# Patient Record
Sex: Female | Born: 1965 | Race: White | Hispanic: No | Smoking: Current every day smoker
Health system: Southern US, Community
[De-identification: ages and names within clinical notes are randomized; demographics above are authoritative.]

## PROBLEM LIST (undated history)

## (undated) DIAGNOSIS — M1990 Unspecified osteoarthritis, unspecified site: Secondary | ICD-10-CM

## (undated) DIAGNOSIS — T8859XA Other complications of anesthesia, initial encounter: Secondary | ICD-10-CM

## (undated) DIAGNOSIS — Z9189 Other specified personal risk factors, not elsewhere classified: Secondary | ICD-10-CM

## (undated) DIAGNOSIS — F32A Depression, unspecified: Secondary | ICD-10-CM

## (undated) DIAGNOSIS — F329 Major depressive disorder, single episode, unspecified: Secondary | ICD-10-CM

## (undated) DIAGNOSIS — T4145XA Adverse effect of unspecified anesthetic, initial encounter: Secondary | ICD-10-CM

## (undated) HISTORY — PX: CARPAL TUNNEL RELEASE: SHX101

## (undated) HISTORY — PX: DENTAL SURGERY: SHX609

---

## 2003-01-21 ENCOUNTER — Emergency Department (HOSPITAL_COMMUNITY): Admission: EM | Admit: 2003-01-21 | Discharge: 2003-01-21 | Payer: Self-pay | Admitting: Emergency Medicine

## 2003-10-22 ENCOUNTER — Encounter (INDEPENDENT_AMBULATORY_CARE_PROVIDER_SITE_OTHER): Payer: Self-pay | Admitting: *Deleted

## 2003-10-22 ENCOUNTER — Encounter: Admission: RE | Admit: 2003-10-22 | Discharge: 2003-10-22 | Payer: Self-pay

## 2004-02-17 ENCOUNTER — Emergency Department (HOSPITAL_COMMUNITY): Admission: EM | Admit: 2004-02-17 | Discharge: 2004-02-17 | Payer: Self-pay | Admitting: Emergency Medicine

## 2004-03-13 ENCOUNTER — Ambulatory Visit (HOSPITAL_BASED_OUTPATIENT_CLINIC_OR_DEPARTMENT_OTHER): Admission: RE | Admit: 2004-03-13 | Discharge: 2004-03-13 | Payer: Self-pay | Admitting: Orthopedic Surgery

## 2004-03-15 ENCOUNTER — Emergency Department (HOSPITAL_COMMUNITY): Admission: EM | Admit: 2004-03-15 | Discharge: 2004-03-15 | Payer: Self-pay | Admitting: Emergency Medicine

## 2004-04-11 ENCOUNTER — Ambulatory Visit (HOSPITAL_BASED_OUTPATIENT_CLINIC_OR_DEPARTMENT_OTHER): Admission: RE | Admit: 2004-04-11 | Discharge: 2004-04-11 | Payer: Self-pay | Admitting: Orthopedic Surgery

## 2004-11-01 ENCOUNTER — Emergency Department (HOSPITAL_COMMUNITY): Admission: EM | Admit: 2004-11-01 | Discharge: 2004-11-01 | Payer: Self-pay | Admitting: Emergency Medicine

## 2008-07-27 ENCOUNTER — Emergency Department (HOSPITAL_COMMUNITY): Admission: EM | Admit: 2008-07-27 | Discharge: 2008-07-27 | Payer: Self-pay | Admitting: Emergency Medicine

## 2010-04-20 LAB — DIFFERENTIAL
Basophils Absolute: 0 10*3/uL (ref 0.0–0.1)
Basophils Relative: 0 % (ref 0–1)
Eosinophils Absolute: 0.2 10*3/uL (ref 0.0–0.7)
Eosinophils Relative: 2 % (ref 0–5)
Lymphocytes Relative: 38 % (ref 12–46)
Lymphs Abs: 3.5 10*3/uL (ref 0.7–4.0)
Monocytes Absolute: 0.7 10*3/uL (ref 0.1–1.0)
Monocytes Relative: 8 % (ref 3–12)
Neutro Abs: 4.8 10*3/uL (ref 1.7–7.7)
Neutrophils Relative %: 52 % (ref 43–77)

## 2010-04-20 LAB — WET PREP, GENITAL
Clue Cells Wet Prep HPF POC: NONE SEEN
Trich, Wet Prep: NONE SEEN
Yeast Wet Prep HPF POC: NONE SEEN

## 2010-04-20 LAB — GC/CHLAMYDIA PROBE AMP, GENITAL
Chlamydia, DNA Probe: NEGATIVE
GC Probe Amp, Genital: NEGATIVE

## 2010-04-20 LAB — CBC
HCT: 39.7 % (ref 36.0–46.0)
Hemoglobin: 13.6 g/dL (ref 12.0–15.0)
MCHC: 34.3 g/dL (ref 30.0–36.0)
MCV: 93.2 fL (ref 78.0–100.0)
Platelets: 303 10*3/uL (ref 150–400)
RBC: 4.26 MIL/uL (ref 3.87–5.11)
RDW: 13.9 % (ref 11.5–15.5)
WBC: 9.2 10*3/uL (ref 4.0–10.5)

## 2010-04-20 LAB — URINALYSIS, ROUTINE W REFLEX MICROSCOPIC
Bilirubin Urine: NEGATIVE
Glucose, UA: NEGATIVE mg/dL
Hgb urine dipstick: NEGATIVE
Ketones, ur: NEGATIVE mg/dL
Nitrite: NEGATIVE
Protein, ur: NEGATIVE mg/dL
Specific Gravity, Urine: 1.01 (ref 1.005–1.030)
Urobilinogen, UA: 1 mg/dL (ref 0.0–1.0)
pH: 6.5 (ref 5.0–8.0)

## 2010-04-20 LAB — POCT PREGNANCY, URINE: Preg Test, Ur: NEGATIVE

## 2010-05-30 NOTE — Op Note (Signed)
[name], [name]         ACCOUNT NO.:  [id number]   MEDICAL RECORD NO.:  [id number]          PATIENT TYPE:  AMB   LOCATION:  DSC                          FACILITY:  [facility]   PHYSICIAN:  Katy Fitch. Sypher Montez Hageman., M.D.DATE OF BIRTH:  Jun 05, 1965   DATE OF PROCEDURE:  04/11/2004  DATE OF DISCHARGE:                                 OPERATIVE REPORT   PREOPERATIVE DIAGNOSIS:  Chronic entrapped neuropathy, right median nerve at  carpal tunnel.   POSTOPERATIVE DIAGNOSIS:  Chronic entrapped neuropathy, right median nerve  at carpal tunnel.   OPERATIONS:  Release of right transcarpal ligament.   OPERATING SURGEON:  Josephine Igo, M.D.   ASSISTANT:  No assistant.   ANESTHESIA:  General by LMA.  Supervising anesthesiologist is Dr. Michelle Piper.   INDICATION:  Jenny Kaufman is a 45 year old woman referred for  evaluation and management of painful and numb hands. She is status post  release of a left transcarpal ligament with good relief of her numbness. She  now presents for similar surgery on the right.  After informed consent, she  is brought to the operating room at this time.   PROCEDURE:  Jenny Kaufman is brought to operating room and placed in  the supine position on the operating table.   Following the induction of general anesthesia by LMA under the supervision  of Dr. Michelle Piper, the right arm was prepped with Betadine soap solution and  sterilely draped.   Following exsanguination of the limb with an Esmarch bandage and arterial  tourniquet on the proximal brachium, it was inflated to 220 mmHg.   Procedure commenced with a short incision in line the ring finger of the  palm. The subcutaneous tissues were carefully divided between the palmar  fascia. This was split longitudinally to encompass the branch of the median  nerve. These followed back to the transcarpal ligament which was carefully  isolated at the median nerve.   Ligament was released along its ulnar border  extending into the distal  forearm.   This widely opened the carpal canal. No mass or other predicaments were  noted. Bleeding points along the margin of the released ligament were  electrocauterized with bipolar current followed by repair of the skin with  intradermal 3-0 Prolene suture.   A compressive dressing was applied with a volar plaster splint with the hand  in 5 degrees of dorsiflexion.   For aftercare,  Ms. Spragg will be given a prescription for Dilaudid 2  mg 1 p.o. q.4-6h p.r.n. pain, total of 20 tablets without refill. She is  also encouraged to use Aleve.      RVS/MEDQ  D:  04/11/2004  T:  04/11/2004  Job:  [id number]

## 2010-05-30 NOTE — Op Note (Signed)
[name], Jenny Kaufman         ACCOUNT NO.:  [id number]   MEDICAL RECORD NO.:  [id number]          PATIENT TYPE:  AMB   LOCATION:  DSC                          FACILITY:  [facility]   PHYSICIAN:  Katy Fitch. Sypher Montez Hageman., M.D.DATE OF BIRTH:  [birth year]   DATE OF PROCEDURE:  03/13/2004  DATE OF DISCHARGE:                                 OPERATIVE REPORT   PREOPERATIVE DIAGNOSIS:  Chronic entrapment neuropathy, median nerve, left  carpal tunnel.   POSTOPERATIVE DIAGNOSIS:  Chronic entrapment neuropathy, median nerve, left  carpal tunnel.   OPERATION:  Release of left transverse carpal ligament.   OPERATING SURGEON:  Katy Fitch. Sypher, M.D.   ASSISTANT:  Annye Rusk, PA-C.   ANESTHESIA:  General by LMA.   SUPERVISING ANESTHESIOLOGIST:  Diamantina Monks, M.D.   INDICATIONS:  Jenny Kaufman is a 45 year old woman referred for  evaluation and management of hand numbness.  Clinical examination revealed  signs of severe bilateral carpal tunnel syndrome.  Electrodiagnostic studies  completed by Dr. Johna Roles confirmed bilateral carpal tunnel syndrome.   Due to a failure to respond to nonoperative measures, she is brought to the  operating room at this time for release of her right transverse carpal  ligament.   PROCEDURE:  Jenny Kaufman was brought to the operating room and placed  in supine position upon the operating table. Following induction general  anesthesia by LMA, the left arm was prepped with Betadine soaping solution  and sterilely draped.  A pneumatic tourniquet was applied to the proximal  brachium.   Following exsanguination of the left arm with an Esmarch bandage, the  arterial tourniquet was inflated to 220 mmHg.   Procedure commenced with a short incision in the line of the ring finger and  the palm.  Subcutaneous tissues were carefully divided, revealing the palmar  fascia.  This was split longitudinally to reveal the common extensor  branches of the median  nerve.  These were followed back to transverse carpal  ligament, which was carefully isolated from the median nerve.  The ligament  was released along its ulnar border.   The carpal canal was widely opened.  No masses or other predicaments were  appreciated.  Bleeding points were controlled by direct pressure, followed  by repair the skin with intradermal 3-0 Prolene suture.   A compressive dressing was applied with a volar plaster splint, maintaining  the wrist in 5 degrees of dorsiflexion.     Note, for aftercare, she is given prescription for [facility] one  tablet p.o. q.4-6 h. p.r.n. pain, 20 tablets without refill.  She is also  advised to use ibuprofen as available over-the-counter, following label  directions.      RVS/MEDQ  D:  03/13/2004  T:  03/14/2004  Job:  [id number]

## 2012-05-27 ENCOUNTER — Encounter (HOSPITAL_COMMUNITY): Payer: Self-pay | Admitting: Emergency Medicine

## 2012-05-27 ENCOUNTER — Emergency Department (HOSPITAL_COMMUNITY): Payer: Self-pay

## 2012-05-27 ENCOUNTER — Emergency Department (HOSPITAL_COMMUNITY)
Admission: EM | Admit: 2012-05-27 | Discharge: 2012-05-27 | Disposition: A | Discharge status: Home / Self Care | Payer: Self-pay | Attending: Emergency Medicine | Admitting: Emergency Medicine

## 2012-05-27 DIAGNOSIS — M272 Inflammatory conditions of jaws: Secondary | ICD-10-CM

## 2012-05-27 DIAGNOSIS — L03211 Cellulitis of face: Secondary | ICD-10-CM | POA: Insufficient documentation

## 2012-05-27 DIAGNOSIS — Z8659 Personal history of other mental and behavioral disorders: Secondary | ICD-10-CM | POA: Insufficient documentation

## 2012-05-27 DIAGNOSIS — F17200 Nicotine dependence, unspecified, uncomplicated: Secondary | ICD-10-CM | POA: Insufficient documentation

## 2012-05-27 DIAGNOSIS — L0201 Cutaneous abscess of face: Secondary | ICD-10-CM | POA: Insufficient documentation

## 2012-05-27 DIAGNOSIS — Z8739 Personal history of other diseases of the musculoskeletal system and connective tissue: Secondary | ICD-10-CM | POA: Insufficient documentation

## 2012-05-27 HISTORY — DX: Adverse effect of unspecified anesthetic, initial encounter: T41.45XA

## 2012-05-27 HISTORY — DX: Other specified personal risk factors, not elsewhere classified: Z91.89

## 2012-05-27 HISTORY — DX: Unspecified osteoarthritis, unspecified site: M19.90

## 2012-05-27 HISTORY — DX: Depression, unspecified: F32.A

## 2012-05-27 HISTORY — DX: Other complications of anesthesia, initial encounter: T88.59XA

## 2012-05-27 HISTORY — DX: Major depressive disorder, single episode, unspecified: F32.9

## 2012-05-27 LAB — CBC WITH DIFFERENTIAL/PLATELET
Basophils Absolute: 0.1 10*3/uL (ref 0.0–0.1)
Basophils Relative: 0 % (ref 0–1)
Eosinophils Absolute: 0.1 10*3/uL (ref 0.0–0.7)
Eosinophils Relative: 1 % (ref 0–5)
HCT: 44.8 % (ref 36.0–46.0)
Hemoglobin: 15.8 g/dL — ABNORMAL HIGH (ref 12.0–15.0)
Lymphocytes Relative: 14 % (ref 12–46)
Lymphs Abs: 2 10*3/uL (ref 0.7–4.0)
MCH: 30.9 pg (ref 26.0–34.0)
MCHC: 35.3 g/dL (ref 30.0–36.0)
MCV: 87.5 fL (ref 78.0–100.0)
Monocytes Absolute: 1.6 10*3/uL — ABNORMAL HIGH (ref 0.1–1.0)
Monocytes Relative: 11 % (ref 3–12)
Neutro Abs: 10.8 10*3/uL — ABNORMAL HIGH (ref 1.7–7.7)
Neutrophils Relative %: 75 % (ref 43–77)
Platelets: 286 10*3/uL (ref 150–400)
RBC: 5.12 MIL/uL — ABNORMAL HIGH (ref 3.87–5.11)
RDW: 14 % (ref 11.5–15.5)
WBC: 14.4 10*3/uL — ABNORMAL HIGH (ref 4.0–10.5)

## 2012-05-27 LAB — BASIC METABOLIC PANEL
BUN: 10 mg/dL (ref 6–23)
CO2: 25 mEq/L (ref 19–32)
Calcium: 9.1 mg/dL (ref 8.4–10.5)
Chloride: 101 mEq/L (ref 96–112)
Creatinine, Ser: 1.09 mg/dL (ref 0.50–1.10)
GFR calc Af Amer: 68 mL/min — ABNORMAL LOW (ref >90)
GFR calc non Af Amer: 59 mL/min — ABNORMAL LOW (ref >90)
Glucose, Bld: 62 mg/dL — ABNORMAL LOW (ref 70–99)
Potassium: 3.7 mEq/L (ref 3.5–5.1)
Sodium: 137 mEq/L (ref 135–145)

## 2012-05-27 LAB — GLUCOSE, CAPILLARY: Glucose-Capillary: 110 mg/dL — ABNORMAL HIGH (ref 70–99)

## 2012-05-27 MED ORDER — PNEUMOCOCCAL VAC POLYVALENT 25 MCG/0.5ML IJ INJ: 0.5000 mL | INJECTION | INTRAMUSCULAR | Status: DC

## 2012-05-27 MED ORDER — CLINDAMYCIN PHOSPHATE 600 MG/50ML IV SOLN
600.0000 mg | Freq: Once | INTRAVENOUS | Status: AC
  Administered 2012-05-27: 600 mg via INTRAVENOUS
  Filled 2012-05-27: qty 50

## 2012-05-27 MED ORDER — MORPHINE SULFATE 4 MG/ML IJ SOLN
4.0000 mg | Freq: Once | INTRAMUSCULAR | Status: AC
  Administered 2012-05-27: 4 mg via INTRAVENOUS
  Filled 2012-05-27: qty 1

## 2012-05-27 MED ORDER — ONDANSETRON HCL 4 MG/2ML IJ SOLN
4.0000 mg | Freq: Once | INTRAMUSCULAR | Status: AC
  Administered 2012-05-27: 4 mg via INTRAVENOUS
  Filled 2012-05-27: qty 2

## 2012-05-27 MED ORDER — IOHEXOL 300 MG/ML  SOLN
100.0000 mL | Freq: Once | INTRAMUSCULAR | Status: AC | PRN
  Administered 2012-05-27: 100 mL via INTRAVENOUS

## 2012-05-27 NOTE — ED Notes (Signed)
Pain and swelling on r/side of face x 24 hrs. Pt has hx of broken tooth in same area. Pt started on an antibiotic 24 hrs ago . (not prescribed for this pt). R/side of face is swollen , red , tender from jaw to eye. Pt is febrile

## 2012-05-27 NOTE — Progress Notes (Signed)
[facility] has seen patient and given her Primary Care Resources, as well as, some Dental Resources.

## 2012-05-27 NOTE — Consult Note (Signed)
Requested by Dr. Freida Busman to assess this patient for admission. Patient states that she has a history of periodontal disease and had at age 47 had likely alveoloplasty for this. She is a continuous smoker and smoked one package of cigarettes a day since her teenage years. She is also a drinker only drinks occasionally In either event the patient broke her #4 to history while eating a pretzel and felt something pushed up into this area. Because of increased facial swelling and warmth to face, she presented with massive facial swelling is CT opthantogram showed 6 million by 10 millimeter abscess to that area.  In the emergency room patient was given one dose of clindamycin  BP 132/80  Pulse 80  Temp(Src) 100.1 F (37.8 C) (Oral)  Resp 18  Wt 51.71 kg (114 lb)  SpO2 100%  LMP 05/23/2012  On exam patient has significant swelling to face, no blurred vision or double vision facial palsy is negative. Smile symmetrical. Sensation intact. Chest clear comparison S2 no murmur rub or gallop Abdomen soft nontender nondistended LE's soft nontender  I called Dr. Ellyn Hack office this afternoon and he requested that I send her right away to have this abscess drained in the office. He states that patient is not necessarily to be hospitalized for the same. He stated that he would see her in followup in place on appropriate antibiotic therapies. Discussed with Dr. Hessie Diener the lack of need for admission.  Pleas Koch, MD [facility] [phone]

## 2012-05-27 NOTE — ED Notes (Addendum)
Pt eating fast food in room. Stated that she had not eaten and she took 10 dosages of antibiotics in last 24 hrs

## 2012-05-27 NOTE — ED Notes (Signed)
Pt given warm blanket, pt placed in gown, pt refuses to take pants off, sts "I'm here for my tooth only"

## 2012-05-27 NOTE — ED Provider Notes (Signed)
History     CSN: [id number]  Arrival date & time 05/27/12  1330   First MD Initiated Contact with Patient 05/27/12 1341      Chief Complaint  Patient presents with  . Dental Pain    broken tooth on r/side of mouth x 24 hrs  . Facial Swelling    Redness, tenderness and swelling on r/side of face incrasing over last 24 hrs    (Consider location/radiation/quality/duration/timing/severity/associated sxs/prior treatment) Patient is a 47 y.o. female presenting with tooth pain. The history is provided by the patient.  Dental Pain  Patient here with right-sided facial swelling which began yesterday but became worse today. Does have a history of multiple dental caries and did bite a pretzel 2 days ago when the pain began at that time. Notes a fever as well. The patient started herself on antibiotics yesterday without relief of symptoms. No vomiting or diarrhea. No trouble swallowing. Symptoms have been getting worse. Past Medical History  Diagnosis Date  . At risk for dental problems   . Arthritis   . Depression     Past Surgical History  Procedure Laterality Date  . Carpal tunnel release    . Dental surgery      History reviewed. No pertinent family history.  History  Substance Use Topics  . Smoking status: Current Every Day Smoker    Types: Cigarettes  . Smokeless tobacco: Not on file  . Alcohol Use: No    OB History   Grav Para Term Preterm Abortions TAB SAB Ect Mult Living                  Review of Systems  All other systems reviewed and are negative.    Allergies  Review of patient's allergies indicates no known allergies.  Home Medications  No current outpatient prescriptions on file.  BP 132/80  Pulse 80  Temp(Src) 100.1 F (37.8 C) (Oral)  Resp 18  Wt 114 lb (51.71 kg)  SpO2 100%  LMP 05/23/2012  Physical Exam  Nursing note and vitals reviewed. Constitutional: She is oriented to person, place, and time. She appears well-developed and  well-nourished.  Non-toxic appearance. No distress.  HENT:  Head: Normocephalic and atraumatic.    Mouth/Throat: Abnormal dentition. Dental caries present.  Eyes: Conjunctivae, EOM and lids are normal. Pupils are equal, round, and reactive to light.  Neck: Normal range of motion. Neck supple. No tracheal deviation present. No mass present.  Cardiovascular: Normal rate, regular rhythm and normal heart sounds.  Exam reveals no gallop.   No murmur heard. Pulmonary/Chest: Effort normal and breath sounds normal. No stridor. No respiratory distress. She has no decreased breath sounds. She has no wheezes. She has no rhonchi. She has no rales.  Abdominal: Soft. Normal appearance and bowel sounds are normal. She exhibits no distension. There is no tenderness. There is no rebound and no CVA tenderness.  Musculoskeletal: Normal range of motion. She exhibits no edema and no tenderness.  Neurological: She is alert and oriented to person, place, and time. She has normal strength. No cranial nerve deficit or sensory deficit. GCS eye subscore is 4. GCS verbal subscore is 5. GCS motor subscore is 6.  Skin: Skin is warm and dry. No abrasion and no rash noted.  Psychiatric: She has a normal mood and affect. Her speech is normal and behavior is normal.    ED Course  Procedures (including critical care time)  Labs Reviewed  CBC WITH DIFFERENTIAL  BASIC METABOLIC PANEL  No results found.   No diagnosis found.    MDM  Pt started on clindamycin and will be admitted to medicine        Toy Baker, MD 05/27/12 1556

## 2012-05-27 NOTE — ED Notes (Addendum)
At present pt denies SOB or difficulty swallowing

## 2012-08-26 ENCOUNTER — Encounter (HOSPITAL_COMMUNITY): Payer: Self-pay | Admitting: Emergency Medicine

## 2012-08-26 ENCOUNTER — Emergency Department (HOSPITAL_COMMUNITY)
Admission: EM | Admit: 2012-08-26 | Discharge: 2012-08-26 | Disposition: A | Discharge status: Home / Self Care | Payer: Self-pay | Attending: Emergency Medicine | Admitting: Emergency Medicine

## 2012-08-26 DIAGNOSIS — F17200 Nicotine dependence, unspecified, uncomplicated: Secondary | ICD-10-CM | POA: Insufficient documentation

## 2012-08-26 DIAGNOSIS — K089 Disorder of teeth and supporting structures, unspecified: Secondary | ICD-10-CM | POA: Insufficient documentation

## 2012-08-26 DIAGNOSIS — K0889 Other specified disorders of teeth and supporting structures: Secondary | ICD-10-CM

## 2012-08-26 DIAGNOSIS — Z791 Long term (current) use of non-steroidal anti-inflammatories (NSAID): Secondary | ICD-10-CM | POA: Insufficient documentation

## 2012-08-26 DIAGNOSIS — Z792 Long term (current) use of antibiotics: Secondary | ICD-10-CM | POA: Insufficient documentation

## 2012-08-26 DIAGNOSIS — Z789 Other specified health status: Secondary | ICD-10-CM | POA: Insufficient documentation

## 2012-08-26 DIAGNOSIS — Z8659 Personal history of other mental and behavioral disorders: Secondary | ICD-10-CM | POA: Insufficient documentation

## 2012-08-26 DIAGNOSIS — M129 Arthropathy, unspecified: Secondary | ICD-10-CM | POA: Insufficient documentation

## 2012-08-26 MED ORDER — HYDROCODONE-ACETAMINOPHEN 5-325 MG PO TABS: 1.0000 | ORAL_TABLET | ORAL | Status: DC | PRN

## 2012-08-26 MED ORDER — AMOXICILLIN 500 MG PO CAPS: 500.0000 mg | ORAL_CAPSULE | Freq: Three times a day (TID) | ORAL | Status: DC

## 2012-08-26 NOTE — ED Notes (Signed)
Left sided tooth pain  X 3 days

## 2012-08-26 NOTE — ED Notes (Signed)
Patient c/o pain and swelling to the left side of her face has been taking antibiotics not RX. States she got them from someone else. States she has had gum surgery in the past and surgery on the right side of her face for dental abscess. C/o pain and swelling to the left side of the  Face for several days.

## 2012-08-26 NOTE — ED Provider Notes (Signed)
CSN: [id number]     Arrival date & time 08/26/12  1305 History     First MD Initiated Contact with Patient 08/26/12 1310     Chief Complaint  Patient presents with  . Dental Pain   (Consider location/radiation/quality/duration/timing/severity/associated sxs/prior Treatment) HPI Comments: Patient presents to the emergency department with a dental complaint. Symptoms began two days ago. The patient has tried to alleviate pain with Tylenol and her roommates old antibiotic prescription.  Pain rated at a 10/10, characterized as throbbing in nature and located left lower jaw. Patient denies fever, night sweats, chills, difficulty swallowing or opening mouth, SOB, nuchal rigidity or decreased ROM of neck.  Patient does not have a dentist and requests a resource guide at discharge.   Patient is a 47 y.o. female presenting with tooth pain.  Dental Pain Associated symptoms: no drooling, no facial swelling, no fever, no headaches and no neck pain     Past Medical History  Diagnosis Date  . At risk for dental problems   . Arthritis   . Depression   . Complication of anesthesia     woke up crying   Past Surgical History  Procedure Laterality Date  . Carpal tunnel release    . Dental surgery     No family history on file. History  Substance Use Topics  . Smoking status: Current Every Day Smoker -- 1.00 packs/day for 37 years    Types: Cigarettes  . Smokeless tobacco: Never Used  . Alcohol Use: No   OB History   Grav Para Term Preterm Abortions TAB SAB Ect Mult Living                 Review of Systems  Constitutional: Negative for fever.  HENT: Positive for dental problem. Negative for sore throat, facial swelling, drooling, trouble swallowing, neck pain and voice change.   Eyes: Negative.   Respiratory: Negative for shortness of breath.   Neurological: Negative for headaches.    Allergies  Review of patient's allergies indicates no known allergies.  Home Medications    Current Outpatient Rx  Name  Route  Sig  Dispense  Refill  . ibuprofen (ADVIL,MOTRIN) 200 MG tablet   Oral   Take 800 mg by mouth every 6 (six) hours as needed for pain.         . naproxen sodium (ANAPROX) 220 MG tablet   Oral   Take 220 mg by mouth 2 (two) times daily with a meal.         . amoxicillin (AMOXIL) 500 MG capsule   Oral   Take 1 capsule (500 mg total) by mouth 3 (three) times daily.   30 capsule   0   . HYDROcodone-acetaminophen (NORCO/VICODIN) 5-325 MG per tablet   Oral   Take 1 tablet by mouth every 4 (four) hours as needed for pain.   6 tablet   0    BP 126/79  Pulse 65  Temp(Src) 97.9 F (36.6 C)  Resp 15  SpO2 99% Physical Exam  Constitutional: She is oriented to person, place, and time. She appears well-developed and well-nourished. No distress.  HENT:  Head: Normocephalic and atraumatic.  Mouth/Throat: Uvula is midline, oropharynx is clear and moist and mucous membranes are normal. No oral lesions. No trismus in the jaw. Abnormal dentition. Dental caries present. No dental abscesses or edematous. No oropharyngeal exudate or tonsillar abscesses.    Eyes: Conjunctivae are normal.  Neck: Normal range of motion. Neck supple.  Neurological:  She is alert and oriented to person, place, and time.  Skin: Skin is warm and dry. She is not diaphoretic.  Psychiatric: She has a normal mood and affect.    ED Course   Procedures (including critical care time)  Labs Reviewed - No data to display No results found. 1. Pain, dental     MDM  Patient with toothache.  No gross abscess.  Exam unconcerning for Ludwig's angina or spread of infection.  Will treat with amoxicillin and pain medicine.  Urged patient to follow-up with dentist. Patient is agreeable to plan. Patient is stable at time of discharge       Jeannetta Ellis, PA-C 08/26/12 1518

## 2012-08-26 NOTE — ED Provider Notes (Signed)
Medical screening examination/treatment/procedure(s) were performed by non-physician practitioner and as supervising physician I was immediately available for consultation/collaboration.  Raeford Razor, MD 08/26/12 [phone]

## 2012-09-05 ENCOUNTER — Emergency Department (HOSPITAL_COMMUNITY)
Admission: EM | Admit: 2012-09-05 | Discharge: 2012-09-05 | Disposition: A | Discharge status: Home / Self Care | Payer: Self-pay | Attending: Emergency Medicine | Admitting: Emergency Medicine

## 2012-09-05 ENCOUNTER — Encounter (HOSPITAL_COMMUNITY): Payer: Self-pay | Admitting: Emergency Medicine

## 2012-09-05 DIAGNOSIS — K047 Periapical abscess without sinus: Secondary | ICD-10-CM | POA: Insufficient documentation

## 2012-09-05 DIAGNOSIS — F17200 Nicotine dependence, unspecified, uncomplicated: Secondary | ICD-10-CM | POA: Insufficient documentation

## 2012-09-05 DIAGNOSIS — Z791 Long term (current) use of non-steroidal anti-inflammatories (NSAID): Secondary | ICD-10-CM | POA: Insufficient documentation

## 2012-09-05 DIAGNOSIS — R509 Fever, unspecified: Secondary | ICD-10-CM | POA: Insufficient documentation

## 2012-09-05 DIAGNOSIS — M129 Arthropathy, unspecified: Secondary | ICD-10-CM | POA: Insufficient documentation

## 2012-09-05 DIAGNOSIS — Z8659 Personal history of other mental and behavioral disorders: Secondary | ICD-10-CM | POA: Insufficient documentation

## 2012-09-05 MED ORDER — CLINDAMYCIN HCL 300 MG PO CAPS
300.0000 mg | ORAL_CAPSULE | Freq: Once | ORAL | Status: AC
  Administered 2012-09-05: 300 mg via ORAL
  Filled 2012-09-05: qty 1

## 2012-09-05 MED ORDER — CLINDAMYCIN HCL 150 MG PO CAPS: 300.0000 mg | ORAL_CAPSULE | Freq: Four times a day (QID) | ORAL | Status: DC

## 2012-09-05 MED ORDER — HYDROCODONE-ACETAMINOPHEN 5-325 MG PO TABS
2.0000 | ORAL_TABLET | Freq: Once | ORAL | Status: AC
  Administered 2012-09-05: 2 via ORAL
  Filled 2012-09-05: qty 2

## 2012-09-05 MED ORDER — HYDROCODONE-ACETAMINOPHEN 5-325 MG PO TABS: ORAL_TABLET | ORAL | Status: DC

## 2012-09-05 MED ORDER — IBUPROFEN 800 MG PO TABS
800.0000 mg | ORAL_TABLET | Freq: Once | ORAL | Status: AC
  Administered 2012-09-05: 800 mg via ORAL
  Filled 2012-09-05: qty 1

## 2012-09-05 NOTE — ED Notes (Addendum)
Pt arrived from home with a complaint of dental pain.  Pt has pain on the left side lower jaw.  Pt was seen on 8/15 at [facility] for the same condition, given antibiotics but has had no relief.  Pt's left side lower jaw appears swollen

## 2012-09-05 NOTE — ED Provider Notes (Signed)
CSN: [id number]     Arrival date & time 09/05/12  4098 History     First MD Initiated Contact with Patient 09/05/12 [phone]     Chief Complaint  Patient presents with  . Dental Pain   (Consider location/radiation/quality/duration/timing/severity/associated sxs/prior Treatment) HPI Pt relates she has had bad teeth for awhile. She states she had an abscess on her right jaw a few months ago. She most recently started having a toothache on August 13 was seen on August 15 at [facility] and started on amoxicillin which she finished yesterday. She said she started having swelling in her left jaw last night that  got worse today. She states she has had subjective fever and chills. She denies nausea or vomiting. She's taken no medicine prior to coming to the ED. She states she does not have a dentist. Although she does state she was sent to a dentist with the right jaw 2 months ago.   PCP None  Past Medical History  Diagnosis Date  . At risk for dental problems   . Arthritis   . Depression   . Complication of anesthesia     woke up crying   Past Surgical History  Procedure Laterality Date  . Carpal tunnel release    . Dental surgery     History reviewed. No pertinent family history. History  Substance Use Topics  . Smoking status: Current Every Day Smoker -- 1.00 packs/day for 37 years    Types: Cigarettes  . Smokeless tobacco: Never Used  . 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    Allergies  Review of patient's allergies indicates no known allergies.  Home Medications   Current Outpatient Rx  Name  Route  Sig  Dispense  Refill  . ibuprofen (ADVIL,MOTRIN) 200 MG tablet   Oral   Take 800 mg by mouth every 6 (six) hours as needed for pain.         . naproxen sodium (ANAPROX) 220  MG tablet   Oral   Take 220 mg by mouth 2 (two) times daily with a meal.          BP 127/94  Pulse 88  Temp(Src) 97.5 F (36.4 C) (Oral)  Resp 17  SpO2 96%  Vital signs normal   Physical Exam  Nursing note and vitals reviewed. Constitutional: She is oriented to person, place, and time. She appears well-developed and well-nourished.  Non-toxic appearance. She does not appear ill. She appears distressed.  HENT:  Head: Normocephalic and atraumatic.    Right Ear: External ear normal.  Left Ear: External ear normal.  Nose: Nose normal. No mucosal edema or rhinorrhea.  Mouth/Throat: Mucous membranes are normal. No dental abscesses or edematous.    Pt has several missing teeth, multiple dental caries at gum line, several teeth half missing from decay. She has a couple of missing teeth in the left lower jaw, then the molar that is half gone from decay with mild swelling of the gums. Pt has swelling of her jaw that is very easily visible externally.   Facial swelling noted  Eyes: Conjunctivae and EOM are normal. Pupils are equal, round, and reactive to light.  Neck: Normal range of  motion and full passive range of motion without pain. Neck supple.  Pulmonary/Chest: Effort normal. No respiratory distress. She has no rhonchi. She exhibits no crepitus.  Abdominal: Normal appearance.  Musculoskeletal: Normal range of motion. She exhibits no edema and no tenderness.  Moves all extremities well.   Neurological: She is alert and oriented to person, place, and time. She has normal strength. No cranial nerve deficit.  Skin: Skin is warm, dry and intact. No rash noted. No erythema. No pallor.  Psychiatric: Her speech is normal. Her mood appears not anxious.  Pt hostile, raising her voice. Nurses report she was having that behavior with them before and after I saw this patient.     ED Course   Medications  clindamycin (CLEOCIN) capsule 300 mg (300 mg Oral Given 09/05/12 8469)    HYDROcodone-acetaminophen (NORCO/VICODIN) 5-325 MG per tablet 2 tablet (2 tablets Oral Given 09/05/12 0722)  ibuprofen (ADVIL,MOTRIN) tablet 800 mg (800 mg Oral Given 09/05/12 6295)    Procedures (including critical care time)  Patient advised to followup with the dentist on call today and make sure she tells him she is from the ED so  [facility] will help subsidize her dental visit.  Pt given a coupon for medication discount at [facility] for clindamycin so it should be under $15, it is no longer on the $4 list at [facility] when I checked this morning.   1. Dental abscess     Discharge Medication List as of 09/05/2012  7:21 AM    START taking these medications   Details  clindamycin (CLEOCIN) 150 MG capsule Take 2 capsules (300 mg total) by mouth every 6 (six) hours., Starting 09/05/2012, Until Discontinued, Print    HYDROcodone-acetaminophen (NORCO/VICODIN) 5-325 MG per tablet Take 1 or 2 po Q 6hrs for pain, Print        Plan discharge  Devoria Albe, MD, FACEP   MDM    Ward Givens, MD 09/05/12 [phone]

## 2012-09-08 ENCOUNTER — Emergency Department (HOSPITAL_COMMUNITY)
Admission: EM | Admit: 2012-09-08 | Discharge: 2012-09-08 | Disposition: A | Discharge status: Home / Self Care | Payer: Self-pay | Attending: Emergency Medicine | Admitting: Emergency Medicine

## 2012-09-08 ENCOUNTER — Encounter (HOSPITAL_COMMUNITY): Payer: Self-pay | Admitting: Emergency Medicine

## 2012-09-08 DIAGNOSIS — Z8659 Personal history of other mental and behavioral disorders: Secondary | ICD-10-CM | POA: Insufficient documentation

## 2012-09-08 DIAGNOSIS — M129 Arthropathy, unspecified: Secondary | ICD-10-CM | POA: Insufficient documentation

## 2012-09-08 DIAGNOSIS — R1310 Dysphagia, unspecified: Secondary | ICD-10-CM | POA: Insufficient documentation

## 2012-09-08 DIAGNOSIS — F17200 Nicotine dependence, unspecified, uncomplicated: Secondary | ICD-10-CM | POA: Insufficient documentation

## 2012-09-08 DIAGNOSIS — Z789 Other specified health status: Secondary | ICD-10-CM | POA: Insufficient documentation

## 2012-09-08 DIAGNOSIS — R509 Fever, unspecified: Secondary | ICD-10-CM | POA: Insufficient documentation

## 2012-09-08 DIAGNOSIS — Z792 Long term (current) use of antibiotics: Secondary | ICD-10-CM | POA: Insufficient documentation

## 2012-09-08 DIAGNOSIS — K047 Periapical abscess without sinus: Secondary | ICD-10-CM | POA: Insufficient documentation

## 2012-09-08 LAB — CBC WITH DIFFERENTIAL/PLATELET
Basophils Relative: 0 % (ref 0–1)
Eosinophils Absolute: 0.1 10*3/uL (ref 0.0–0.7)
Lymphs Abs: 2.2 10*3/uL (ref 0.7–4.0)
MCH: 31.6 pg (ref 26.0–34.0)
Neutrophils Relative %: 79 % — ABNORMAL HIGH (ref 43–77)
Platelets: 407 10*3/uL — ABNORMAL HIGH (ref 150–400)
RBC: 5.09 MIL/uL (ref 3.87–5.11)
WBC: 17.2 10*3/uL — ABNORMAL HIGH (ref 4.0–10.5)

## 2012-09-08 LAB — BASIC METABOLIC PANEL
GFR calc Af Amer: 70 mL/min — ABNORMAL LOW (ref >90)
GFR calc non Af Amer: 61 mL/min — ABNORMAL LOW (ref >90)
Glucose, Bld: 105 mg/dL — ABNORMAL HIGH (ref 70–99)
Potassium: 3.7 mEq/L (ref 3.5–5.1)
Sodium: 134 mEq/L — ABNORMAL LOW (ref 135–145)

## 2012-09-08 MED ORDER — CLINDAMYCIN PHOSPHATE 600 MG/50ML IV SOLN
600.0000 mg | Freq: Once | INTRAVENOUS | Status: AC
  Administered 2012-09-08: 600 mg via INTRAVENOUS
  Filled 2012-09-08: qty 50

## 2012-09-08 MED ORDER — FENTANYL CITRATE 0.05 MG/ML IJ SOLN
100.0000 ug | Freq: Once | INTRAMUSCULAR | Status: AC
  Administered 2012-09-08: 100 ug via INTRAVENOUS
  Filled 2012-09-08: qty 2

## 2012-09-08 MED ORDER — MORPHINE SULFATE 4 MG/ML IJ SOLN
4.0000 mg | Freq: Once | INTRAMUSCULAR | Status: AC
  Administered 2012-09-08: 4 mg via INTRAVENOUS
  Filled 2012-09-08: qty 1

## 2012-09-08 MED ORDER — SODIUM CHLORIDE 0.9 % IV SOLN
INTRAVENOUS | Status: DC
  Administered 2012-09-08: 11:00:00 via INTRAVENOUS

## 2012-09-08 MED ORDER — SODIUM CHLORIDE 0.9 % IV BOLUS (SEPSIS)
500.0000 mL | Freq: Once | INTRAVENOUS | Status: AC
  Administered 2012-09-08: 500 mL via INTRAVENOUS

## 2012-09-08 MED ORDER — METRONIDAZOLE IN NACL 5-0.79 MG/ML-% IV SOLN
500.0000 mg | Freq: Once | INTRAVENOUS | Status: AC
  Administered 2012-09-08: 500 mg via INTRAVENOUS
  Filled 2012-09-08: qty 100

## 2012-09-08 MED ORDER — ONDANSETRON HCL 4 MG/2ML IJ SOLN
4.0000 mg | Freq: Once | INTRAMUSCULAR | Status: AC
  Administered 2012-09-08: 4 mg via INTRAVENOUS
  Filled 2012-09-08: qty 2

## 2012-09-08 NOTE — Progress Notes (Signed)
[facility] provided patient with a [facility] card app, primary care resources, dental resources, information about the free dental clinic in Sept.

## 2012-09-08 NOTE — ED Notes (Signed)
Pt presenting to ed with c/o dental abscess that's worse pt states she was seen at [facility] 8[facility] and she has seen a dentist but swelling is worse and she is scheduled to have tooth removed next Tuesday but she can't wait. Pt states she's having difficulty with swallowing and eating

## 2012-09-08 NOTE — ED Provider Notes (Signed)
CSN: [id number]     Arrival date & time 09/08/12  1050 History   First MD Initiated Contact with Patient 09/08/12 1059     Chief Complaint  Patient presents with  . dental abscess   (Consider location/radiation/quality/duration/timing/severity/associated sxs/prior Treatment) HPI  JESSIECA RHEM is a 47 y.o.female without any significant PMH presents to the ER with complaints of dental abscess to left lower jaw line. She was seen at [facility] on 8/25 for the same and given Clindamycin PO abx and referred to a dentist. Symptoms first began weeks ago. The dentist saw her today and informed her that she needed to go to the ER for IV antibiotics due to the severity of her symptoms. She endorses having trismus, choking on her saliva, being unable to eat or drink. She also endorses subjective fevers and chills. Denies neck pain or throat pain.       Past Medical History  Diagnosis Date  . At risk for dental problems   . Arthritis   . Depression   . Complication of anesthesia     woke up crying   Past Surgical History  Procedure Laterality Date  . Carpal tunnel release    . Dental surgery     No family history on file. History  Substance Use Topics  . Smoking status: Current Every Day Smoker -- 1.00 packs/day for 37 years    Types: Cigarettes  . Smokeless tobacco: Never Used  . Alcohol Use: No   OB History   Grav Para Term Preterm Abortions TAB SAB Ect Mult Living                 Review of Systems ROS is negative unless otherwise stated in the HPI  Allergies  Review of patient's allergies indicates no known allergies.  Home Medications   Current Outpatient Rx  Name  Route  Sig  Dispense  Refill  . amoxicillin (AMOXIL) 500 MG capsule   Oral   Take 500 mg by mouth 3 (three) times daily.         Marland Kitchen HYDROcodone-acetaminophen (NORCO/VICODIN) 5-325 MG per tablet   Oral   Take 1-2 tablets by mouth every 6 (six) hours as needed for pain.         Marland Kitchen ibuprofen  (ADVIL,MOTRIN) 200 MG tablet   Oral   Take 800 mg by mouth every 8 (eight) hours as needed for pain.           BP 113/69  Pulse 64  Temp(Src) 98.3 F (36.8 C) (Axillary)  Resp 20  SpO2 100% Physical Exam  Constitutional: She appears well-developed and well-nourished. She appears distressed.  HENT:  Head: Normocephalic and atraumatic.    Mouth/Throat: Uvula is midline, oropharynx is clear and moist and mucous membranes are normal. There is trismus in the jaw. Normal dentition. Dental abscesses and dental caries present. No edematous.  Eyes: Pupils are equal, round, and reactive to light.  Neck: Trachea normal, normal range of motion and full passive range of motion without pain. Neck supple.  Cardiovascular: Normal rate, regular rhythm, normal heart sounds and normal pulses.   Pulmonary/Chest: Effort normal and breath sounds normal. No respiratory distress. Chest wall is not dull to percussion. She exhibits no tenderness, no crepitus, no edema, no deformity and no retraction.  Abdominal: Normal appearance.  Musculoskeletal: Normal range of motion.  Neurological: She is alert. She has normal strength.  Skin: Skin is warm, dry and intact. She is not diaphoretic.  Psychiatric: She has  a normal mood and affect. Her speech is normal. Cognition and memory are normal.    ED Course  Procedures (including critical care time) Labs Review Labs Reviewed  CBC WITH DIFFERENTIAL - Abnormal; Notable for the following:    WBC 17.2 (*)    Hemoglobin 16.1 (*)    MCHC 36.2 (*)    Platelets 407 (*)    Neutrophils Relative % 79 (*)    Neutro Abs 13.6 (*)    Monocytes Absolute 1.3 (*)    All other components within normal limits  BASIC METABOLIC PANEL - Abnormal; Notable for the following:    Sodium 134 (*)    Chloride 95 (*)    Glucose, Bld 105 (*)    GFR calc non Af Amer 61 (*)    GFR calc Af Amer 70 (*)    All other components within normal limits   Imaging Review No results  found.  MDM   1. Dental abscess     I spoke with Dr. Alanson Puls (dental) who endorses being unable to pull the tooth until a significant amount of the swelling has gone down. This may take a few days. Because the swelling is increasing despite the abx (clinda and amoxicillin) and her now having difficulty swallowing and unable to eat he feels that maxillofacial is worth calling. He recommended I give Flagyl to cover for anaerobes. Pt unable to swallow therefore will be given through IV. 11:30am- page out to [facility] placed.  12;00pm-  I spoke with dr. Jeanice Lim who endorses that the tooth needs to be removed for the swelling to improve. Pt to be sent over to his office from the ED and she will be worked into his schedule to have the abscess drained.  Patients pain somewhat controlled in the ED. Will hydrate and send over to Dr. Rhona Raider office.   Pt to be NPO.   47 y.o.Rumaisa C Gindlesperger's evaluation in the Emergency Department is complete. It has been determined that no acute conditions requiring further emergency intervention are present at this time. The patient/guardian have been advised of the diagnosis and plan. We have discussed signs and symptoms that warrant return to the ED, such as changes or worsening in symptoms.  Vital signs are stable at discharge. Filed Vitals:   09/08/12 1054  BP: 113/69  Pulse: 64  Temp: 98.3 F (36.8 C)  Resp: 20    Patient/guardian has voiced understanding and agreed to follow-up with the PCP or specialist.   Dorthula Matas, PA-C 09/08/12 1208

## 2012-09-14 NOTE — ED Provider Notes (Signed)
Medical screening examination/treatment/procedure(s) were performed by non-physician practitioner and as supervising physician I was immediately available for consultation/collaboration.  Jeidi Gilles, MD 09/14/12 1501 

## 2012-11-06 ENCOUNTER — Emergency Department (HOSPITAL_COMMUNITY)
Admission: EM | Admit: 2012-11-06 | Discharge: 2012-11-06 | Disposition: A | Discharge status: Home / Self Care | Payer: Self-pay | Attending: Emergency Medicine | Admitting: Emergency Medicine

## 2012-11-06 ENCOUNTER — Encounter (HOSPITAL_COMMUNITY): Payer: Self-pay | Admitting: Emergency Medicine

## 2012-11-06 DIAGNOSIS — K089 Disorder of teeth and supporting structures, unspecified: Secondary | ICD-10-CM | POA: Insufficient documentation

## 2012-11-06 DIAGNOSIS — M129 Arthropathy, unspecified: Secondary | ICD-10-CM | POA: Insufficient documentation

## 2012-11-06 DIAGNOSIS — K0889 Other specified disorders of teeth and supporting structures: Secondary | ICD-10-CM

## 2012-11-06 DIAGNOSIS — Z789 Other specified health status: Secondary | ICD-10-CM | POA: Insufficient documentation

## 2012-11-06 DIAGNOSIS — F329 Major depressive disorder, single episode, unspecified: Secondary | ICD-10-CM | POA: Insufficient documentation

## 2012-11-06 DIAGNOSIS — K029 Dental caries, unspecified: Secondary | ICD-10-CM | POA: Insufficient documentation

## 2012-11-06 DIAGNOSIS — Z8659 Personal history of other mental and behavioral disorders: Secondary | ICD-10-CM | POA: Insufficient documentation

## 2012-11-06 DIAGNOSIS — F3289 Other specified depressive episodes: Secondary | ICD-10-CM | POA: Insufficient documentation

## 2012-11-06 DIAGNOSIS — F17200 Nicotine dependence, unspecified, uncomplicated: Secondary | ICD-10-CM | POA: Insufficient documentation

## 2012-11-06 DIAGNOSIS — K0262 Dental caries on smooth surface penetrating into dentin: Secondary | ICD-10-CM

## 2012-11-06 MED ORDER — PENICILLIN V POTASSIUM 500 MG PO TABS
500.0000 mg | ORAL_TABLET | Freq: Once | ORAL | Status: AC
  Administered 2012-11-06: 500 mg via ORAL
  Filled 2012-11-06: qty 1

## 2012-11-06 MED ORDER — TRAMADOL HCL 50 MG PO TABS: 50.0000 mg | ORAL_TABLET | Freq: Once | ORAL | Status: DC

## 2012-11-06 MED ORDER — OXYCODONE-ACETAMINOPHEN 10-325 MG PO TABS: 1.0000 | ORAL_TABLET | ORAL | Status: DC | PRN

## 2012-11-06 MED ORDER — PENICILLIN V POTASSIUM 500 MG PO TABS: 500.0000 mg | ORAL_TABLET | Freq: Four times a day (QID) | ORAL | Status: DC

## 2012-11-06 MED ORDER — CLINDAMYCIN HCL 300 MG PO CAPS: 300.0000 mg | ORAL_CAPSULE | Freq: Four times a day (QID) | ORAL | Status: DC

## 2012-11-06 MED ORDER — TRAMADOL HCL 50 MG PO TABS
50.0000 mg | ORAL_TABLET | Freq: Once | ORAL | Status: AC
  Administered 2012-11-06: 50 mg via ORAL
  Filled 2012-11-06: qty 1

## 2012-11-06 NOTE — ED Provider Notes (Signed)
CSN: [id number]     Arrival date & time 11/06/12  2106 History   None    This chart was scribed for non-physician practitioner, Ivonne Andrew PA-C,  working with Toy Baker, MD by Arlan Organ, ED Scribe. This patient was seen in room WTR5/WTR5 and the patient's care was started at 10:27 PM.   Chief Complaint  Patient presents with  . Dental Pain   The history is provided by the patient. No language interpreter was used.   HPI Comments: Jenny Kaufman is a 47 y.o. female with a hx of dental pain and abscesses who presents to the Emergency Department complaining of gradual onset, gradually worsening, constant reoccurring episode of dental pain that started 2 days ago. Pt states she was here yesterday and was given antibiotics and tramadol. She is concerned because last time she was on penicillin she developed a large swelling of her face anyway. She also states tramadol has not helped with her pain. She states she feels the pain could be gradually turning into another abscess, and states the pain is unbearable. Pt denies fever, chills, diaphoresis. No difficulty breathing or swallowing. No other aggravating or alleviating factors. No other associated symptoms.    Past Medical History  Diagnosis Date  . At risk for dental problems   . Arthritis   . Depression   . Complication of anesthesia     woke up crying   Past Surgical History  Procedure Laterality Date  . Carpal tunnel release    . Dental surgery     History reviewed. No pertinent family history. History  Substance Use Topics  . Smoking status: Current Every Day Smoker -- 1.00 packs/day for 37 years    Types: Cigarettes  . Smokeless tobacco: Never Used  . Alcohol Use: No   OB History   Grav Para Term Preterm Abortions TAB SAB Ect Mult Living                 Review of Systems  All other systems reviewed and are negative.    Allergies  Review of patient's allergies indicates no known allergies.  Home  Medications   Current Outpatient Rx  Name  Route  Sig  Dispense  Refill  . ibuprofen (ADVIL,MOTRIN) 200 MG tablet   Oral   Take 800 mg by mouth every 8 (eight) hours as needed for pain.          Marland Kitchen penicillin v potassium (VEETID) 500 MG tablet   Oral   Take 1 tablet (500 mg total) by mouth 4 (four) times daily.   39 tablet   0   . traMADol (ULTRAM) 50 MG tablet   Oral   Take 1 tablet (50 mg total) by mouth once.   30 tablet   0    BP 140/91  Pulse 94  Temp(Src) 97.9 F (36.6 C) (Oral)  SpO2 100%  LMP 10/16/2012  Physical Exam  Nursing note and vitals reviewed. Constitutional: She is oriented to person, place, and time. She appears well-developed and well-nourished. No distress.  HENT:  Head: Normocephalic and atraumatic.  Mouth/Throat: Oropharynx is clear and moist.    Pain  over 1st premolar Significant dental caries No significant swelling or drainable abscess  No significant facial swelling   Eyes: EOM are normal.  Neck: Normal range of motion. No tracheal deviation present.  Cardiovascular: Normal rate.   Pulmonary/Chest: Effort normal. No stridor.  Musculoskeletal: Normal range of motion.  Lymphadenopathy:    She  has no cervical adenopathy.  Neurological: She is alert and oriented to person, place, and time.  Skin: Skin is warm and dry.  Psychiatric: She has a normal mood and affect. Her behavior is normal.    ED Course  Procedures   DIAGNOSTIC STUDIES: Oxygen Saturation is 100% on RA, Normal by my interpretation.    COORDINATION OF CARE: 10:28 PM- patient seen and evaluated. No concerning signs of significant dental abscess at this time. She is afebrile. Will give pain medication and a different antibiotic. Will refer to dentist. Discussed treatment plan with pt at bedside and pt agreed to plan.       MDM   1. Pain, dental   2. Dental caries      I personally performed the services described in this documentation, which was scribed in my  presence. The recorded information has been reviewed and is accurate.   Angus Seller, PA-C 11/07/12 [phone]

## 2012-11-06 NOTE — ED Provider Notes (Signed)
CSN: [id number]     Arrival date & time 11/06/12  0141 History   First MD Initiated Contact with Patient 11/06/12 0211     Chief Complaint  Patient presents with  . Dental Pain   (Consider location/radiation/quality/duration/timing/severity/associated sxs/prior Treatment) HPI Comments: Patient with very poor dentition.  Presents today with pain in her upper right gumline the canine, first and second molars are decayed into the gum surrounding gum erythema.  She, states she's been taking ibuprofen, without any relief  Patient is a 47 y.o. female presenting with tooth pain. The history is provided by the patient.  Dental Pain Location:  Upper Upper teeth location:  6/RU cuspid, 5/RU 1st bicuspid and 4/RU 2nd bicuspid Quality:  Aching Timing:  Constant Progression:  Worsening Chronicity:  Chronic Associated symptoms: no fever     Past Medical History  Diagnosis Date  . At risk for dental problems   . Arthritis   . Depression   . Complication of anesthesia     woke up crying   Past Surgical History  Procedure Laterality Date  . Carpal tunnel release    . Dental surgery     History reviewed. No pertinent family history. History  Substance Use Topics  . Smoking status: Current Every Day Smoker -- 1.00 packs/day for 37 years    Types: Cigarettes  . Smokeless tobacco: Never Used  . Alcohol Use: No   OB History   Grav Para Term Preterm Abortions TAB SAB Ect Mult Living                 Review of Systems  Constitutional: Negative for fever.  HENT: Positive for dental problem. Negative for trouble swallowing.   Neurological: Negative for dizziness and weakness.  All other systems reviewed and are negative.    Allergies  Review of patient's allergies indicates no known allergies.  Home Medications   Current Outpatient Rx  Name  Route  Sig  Dispense  Refill  . ibuprofen (ADVIL,MOTRIN) 200 MG tablet   Oral   Take 800 mg by mouth every 8 (eight) hours as needed for  pain.          Marland Kitchen penicillin v potassium (VEETID) 500 MG tablet   Oral   Take 1 tablet (500 mg total) by mouth 4 (four) times daily.   39 tablet   0   . traMADol (ULTRAM) 50 MG tablet   Oral   Take 1 tablet (50 mg total) by mouth once.   30 tablet   0    BP 159/91  Pulse 73  Temp(Src) 98 F (36.7 C) (Oral)  Resp 20  Wt 119 lb (53.978 kg)  SpO2 100% Physical Exam  Nursing note and vitals reviewed. Constitutional: She appears well-developed and well-nourished.  HENT:  Head: Normocephalic.  Right Ear: External ear normal.  Left Ear: External ear normal.  Mouth/Throat:    Eyes: Pupils are equal, round, and reactive to light.  Neck: Normal range of motion.  Cardiovascular: Normal rate.   Pulmonary/Chest: Effort normal.  Musculoskeletal: Normal range of motion.  Lymphadenopathy:    She has no cervical adenopathy.  Neurological: She is alert.  Skin: Skin is warm. No rash noted. No pallor.    ED Course  Procedures (including critical care time) Labs Review Labs Reviewed - No data to display Imaging Review No results found.  EKG Interpretation   None       MDM   1. Dental caries into dentine  Prescribe penicillin, Tylenol, referred the patient to Dr. Lottie Mussel on call, Dr. as well as given her information on    Arman Filter, NP 11/06/12 [phone]

## 2012-11-06 NOTE — ED Notes (Signed)
Pt arrived to ED with a complaint of dental pain.  Pt has had dental surgery but continues to have pain.  Pt's pain is located right upper

## 2012-11-06 NOTE — ED Notes (Signed)
Pt states hx of dental pain and abcesses that had to be drained last time because of the infection. Pain is beside one of the teeth on the R upper side where she has had an abcess before.

## 2012-11-07 NOTE — ED Provider Notes (Signed)
Medical screening examination/treatment/procedure(s) were performed by non-physician practitioner and as supervising physician I was immediately available for consultation/collaboration.   Sunnie Nielsen, MD 11/07/12 [phone]

## 2012-11-09 NOTE — ED Provider Notes (Signed)
Medical screening examination/treatment/procedure(s) were performed by non-physician practitioner and as supervising physician I was immediately available for consultation/collaboration.   Virdia Ziesmer T Arnetra Terris, MD 11/09/12 1333 

## 2014-07-24 ENCOUNTER — Encounter (HOSPITAL_COMMUNITY): Payer: Self-pay | Admitting: Emergency Medicine

## 2014-07-24 ENCOUNTER — Emergency Department (HOSPITAL_COMMUNITY)
Admission: EM | Admit: 2014-07-24 | Discharge: 2014-07-24 | Disposition: A | Discharge status: Home / Self Care | Payer: Self-pay | Attending: Emergency Medicine | Admitting: Emergency Medicine

## 2014-07-24 DIAGNOSIS — Y9389 Activity, other specified: Secondary | ICD-10-CM | POA: Insufficient documentation

## 2014-07-24 DIAGNOSIS — Z720 Tobacco use: Secondary | ICD-10-CM | POA: Insufficient documentation

## 2014-07-24 DIAGNOSIS — Z792 Long term (current) use of antibiotics: Secondary | ICD-10-CM | POA: Insufficient documentation

## 2014-07-24 DIAGNOSIS — Y998 Other external cause status: Secondary | ICD-10-CM | POA: Insufficient documentation

## 2014-07-24 DIAGNOSIS — G8929 Other chronic pain: Secondary | ICD-10-CM | POA: Insufficient documentation

## 2014-07-24 DIAGNOSIS — W010XXA Fall on same level from slipping, tripping and stumbling without subsequent striking against object, initial encounter: Secondary | ICD-10-CM | POA: Insufficient documentation

## 2014-07-24 DIAGNOSIS — Y92007 Garden or yard of unspecified non-institutional (private) residence as the place of occurrence of the external cause: Secondary | ICD-10-CM | POA: Insufficient documentation

## 2014-07-24 DIAGNOSIS — M5442 Lumbago with sciatica, left side: Secondary | ICD-10-CM | POA: Insufficient documentation

## 2014-07-24 DIAGNOSIS — Z8659 Personal history of other mental and behavioral disorders: Secondary | ICD-10-CM | POA: Insufficient documentation

## 2014-07-24 DIAGNOSIS — M1990 Unspecified osteoarthritis, unspecified site: Secondary | ICD-10-CM | POA: Insufficient documentation

## 2014-07-24 MED ORDER — CYCLOBENZAPRINE HCL 10 MG PO TABS: 10.0000 mg | ORAL_TABLET | Freq: Two times a day (BID) | ORAL | Status: DC | PRN

## 2014-07-24 MED ORDER — TRAMADOL HCL 50 MG PO TABS: 50.0000 mg | ORAL_TABLET | Freq: Four times a day (QID) | ORAL | Status: DC | PRN

## 2014-07-24 MED ORDER — PREDNISONE 20 MG PO TABS
60.0000 mg | ORAL_TABLET | Freq: Once | ORAL | Status: AC
  Administered 2014-07-24: 60 mg via ORAL
  Filled 2014-07-24: qty 3

## 2014-07-24 MED ORDER — PREDNISONE 20 MG PO TABS: 40.0000 mg | ORAL_TABLET | Freq: Every day | ORAL | Status: DC

## 2014-07-24 MED ORDER — HYDROCODONE-ACETAMINOPHEN 5-325 MG PO TABS
2.0000 | ORAL_TABLET | Freq: Once | ORAL | Status: AC
  Administered 2014-07-24: 2 via ORAL
  Filled 2014-07-24: qty 2

## 2014-07-24 MED ORDER — HYDROCODONE-ACETAMINOPHEN 5-325 MG PO TABS: 2.0000 | ORAL_TABLET | ORAL | Status: DC | PRN

## 2014-07-24 NOTE — ED Notes (Signed)
After multiple different requests from the patient, now waiting to ask PA about request for orthopedic consult for patient.

## 2014-07-24 NOTE — ED Notes (Signed)
Per patient, fell in yard, unable to get up-has a history of chronic back pain/sciatica

## 2014-07-24 NOTE — ED Provider Notes (Signed)
History  This chart was scribed for non-physician practitioner, Danelle Berry, PA-C,working with Gilda Crease, MD, by Karle Plumber, ED Scribe. This patient was seen in room WTR6/WTR6 and the patient's care was started at 5:04 PM.  Chief Complaint  Patient presents with  . Back Pain   The history is provided by the patient and medical records. No language interpreter was used.    HPI Comments:  Jenny Kaufman is a 49 y.o. female with PMHx of chronic back pain who presents to the Emergency Department complaining of severe lower back pain that radiates down her left thigh that began earlier today after tripping and falling in her yard and was unable to get up. She reports associated numbness of her right leg that is normal at baseline. She has not done anything to treat her pain. Sitting with pressure on the left leg makes the pain worse. She denies alleviating factors. She denies head trauma, LOC, new numbness, tingling or weakness of the lower extremities, fever, chills, diaphoresis, bowel or bladder incontinence, nausea or vomiting. Denies h/o IV drug use or cancer. Pt endorses daily cigarette use. Denies allergies to any medications.   Past Medical History  Diagnosis Date  . At risk for dental problems   . Arthritis   . Depression   . Complication of anesthesia     woke up crying   Past Surgical History  Procedure Laterality Date  . Carpal tunnel release    . Dental surgery     No family history on file. History  Substance Use Topics  . Smoking status: Current Every Day Smoker -- 1.00 packs/day for 37 years    Types: Cigarettes  . Smokeless tobacco: Never Used  . Alcohol Use: No   OB History    No data available     Review of Systems  10 Systems reviewed and are negative for acute change except as noted in the HPI.     Allergies  Review of patient's allergies indicates no known allergies.  Home Medications   Prior to Admission medications    Medication Sig Start Date End Date Taking? Authorizing Provider  clindamycin (CLEOCIN) 300 MG capsule Take 1 capsule (300 mg total) by mouth 4 (four) times daily. X 7 days 11/06/12   Ivonne Andrew, PA-C  ibuprofen (ADVIL,MOTRIN) 200 MG tablet Take 800 mg by mouth every 8 (eight) hours as needed for pain.     Historical Provider, MD  oxyCODONE-acetaminophen (PERCOCET) 10-325 MG per tablet Take 1 tablet by mouth every 4 (four) hours as needed for pain. 11/06/12   Ivonne Andrew, PA-C  penicillin v potassium (VEETID) 500 MG tablet Take 1 tablet (500 mg total) by mouth 4 (four) times daily. 11/06/12   Earley Favor, NP  traMADol (ULTRAM) 50 MG tablet Take 1 tablet (50 mg total) by mouth once. 11/06/12   Earley Favor, NP   Triage Vitals: BP 109/68 mmHg  Pulse 66  Temp(Src) 97.7 F (36.5 C) (Oral)  SpO2 100% Physical Exam  Constitutional: She is oriented to person, place, and time. She appears well-developed and well-nourished. No distress.  HENT:  Head: Normocephalic and atraumatic.  Right Ear: External ear normal.  Left Ear: External ear normal.  Nose: Nose normal.  Mouth/Throat: Oropharynx is clear and moist. No oropharyngeal exudate.  Eyes: Conjunctivae and EOM are normal. Pupils are equal, round, and reactive to light. Right eye exhibits no discharge. Left eye exhibits no discharge. No scleral icterus.  Neck: Normal range of motion. Neck  supple. No JVD present. No tracheal deviation present.  Cardiovascular: Normal rate and regular rhythm.   Pulmonary/Chest: Effort normal and breath sounds normal. No stridor. No respiratory distress.  Abdominal: Soft. Bowel sounds are normal.  Musculoskeletal: Normal range of motion. She exhibits no edema.       Thoracic back: Normal. She exhibits normal range of motion, no tenderness, no swelling, no edema and no deformity.       Back:  Lymphadenopathy:    She has no cervical adenopathy.  Neurological: She is alert and oriented to person, place, and time.  She has normal strength. No cranial nerve deficit or sensory deficit. She exhibits normal muscle tone. Coordination and gait normal.  Reflex Scores:      Patellar reflexes are 2+ on the right side and 2+ on the left side. Skin: Skin is warm and dry. No rash noted. She is not diaphoretic. No erythema. No pallor.  Psychiatric: She has a normal mood and affect. Her behavior is normal. Judgment and thought content normal.    ED Course  Procedures (including critical care time) DIAGNOSTIC STUDIES: Oxygen Saturation is 100% on RA, normal by my interpretation.   COORDINATION OF CARE: 5:10 PM- Pt states she does not feel as if she fractured a bone so she declines X-Ray. Will prescribe Prednisone. Will order pain medication prior to discharge. Pt verbalizes understanding and agrees to plan.  Medications - No data to display  Labs Review Labs Reviewed - No data to display  Imaging Review No results found.   EKG Interpretation None      MDM   Final diagnoses:  None    Patient with back pain.  No neurological deficits and normal neuro exam.  Patient can walk without difficulty, but states it is painful.  No loss of bowel or bladder control.  No concern for cauda equina.  No fever, night sweats, weight loss, h/o cancer, IVDU.  RICE protocol and pain medicine indicated and discussed with patient.    I personally performed the services described in this documentation, which was scribed in my presence. The recorded information has been reviewed and is accurate.    Danelle BerryLeisa Delrose Rohwer, PA-C 07/27/14 1208  Gilda Creasehristopher J Pollina, MD 07/28/14 [phone]

## 2014-07-24 NOTE — Discharge Instructions (Signed)
Back Pain, Adult °Back pain is very common. The pain often gets better over time. The cause of back pain is usually not dangerous. Most people can learn to manage their back pain on their own.  °HOME CARE  °· Stay active. Start with short walks on flat ground if you can. Try to walk farther each day. °· Do not sit, drive, or stand in one place for more than 30 minutes. Do not stay in bed. °· Do not avoid exercise or work. Activity can help your back heal faster. °· Be careful when you bend or lift an object. Bend at your knees, keep the object close to you, and do not twist. °· Sleep on a firm mattress. Lie on your side, and bend your knees. If you lie on your back, put a pillow under your knees. °· Only take medicines as told by your doctor. °· Put ice on the injured area. °¨ Put ice in a plastic bag. °¨ Place a towel between your skin and the bag. °¨ Leave the ice on for 15-20 minutes, 03-04 times a day for the first 2 to 3 days. After that, you can switch between ice and heat packs. °· Ask your doctor about back exercises or massage. °· Avoid feeling anxious or stressed. Find good ways to deal with stress, such as exercise. °GET HELP RIGHT AWAY IF:  °· Your pain does not go away with rest or medicine. °· Your pain does not go away in 1 week. °· You have new problems. °· You do not feel well. °· The pain spreads into your legs. °· You cannot control when you poop (bowel movement) or pee (urinate). °· Your arms or legs feel weak or lose feeling (numbness). °· You feel sick to your stomach (nauseous) or throw up (vomit). °· You have belly (abdominal) pain. °· You feel like you may pass out (faint). °MAKE SURE YOU:  °· Understand these instructions. °· Will watch your condition. °· Will get help right away if you are not doing well or get worse. °Document Released: 06/17/2007 Document Revised: 03/23/2011 Document Reviewed: 05/02/2013 °[employer]® Patient Information ©2015 [employer]. This information is not intended  to replace advice given to you by your health care provider. Make sure you discuss any questions you have with your health care provider. ° °

## 2014-10-11 IMAGING — CT CT MAXILLOFACIAL W/ CM
3 series · 16 of 47 positions shown, 19 images · IV contrast (omnipaque)
Comparison: None.

CLINICAL DATA: Right facial swelling.  Fever and pain

CT MAXILLOFACIAL WITH CONTRAST
TECHNIQUE: Multidetector CT imaging of the maxillofacial
structures was performed with intravenous contrast. Multiplanar CT
image reconstructions were also generated.
Contrast: 100mL OMNIPAQUE IOHEXOL 300 MG/ML  SOLN

[Series 3: facial st · axial · 0.29mm/px · z∈[-190,-60]mm · 10 of 77 slices shown, 13 images]
[im 6/77  brain]
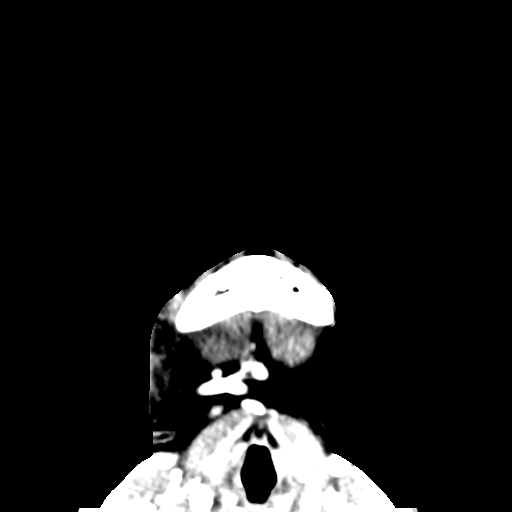
[im 6/77  bone]
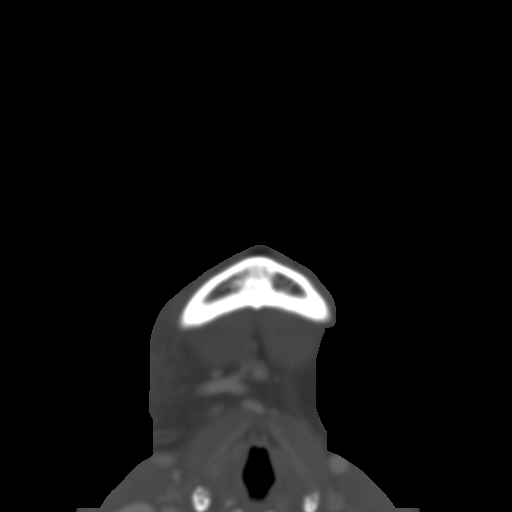
[im 14/77  bone]
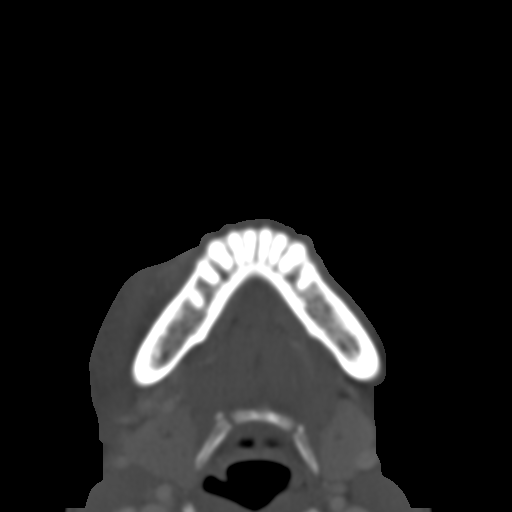
[im 21/77  bone]
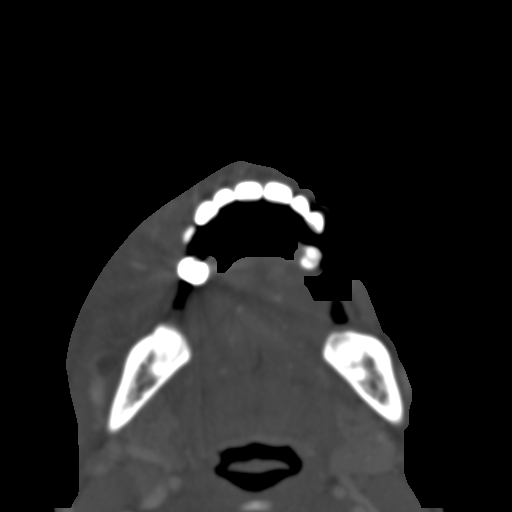
[im 27/77  bone]
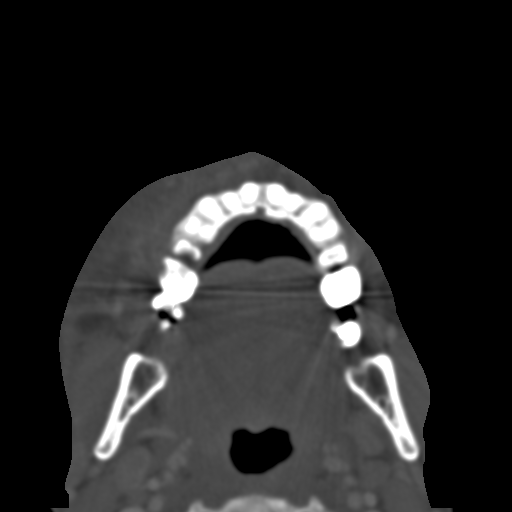
[im 35/77  brain]
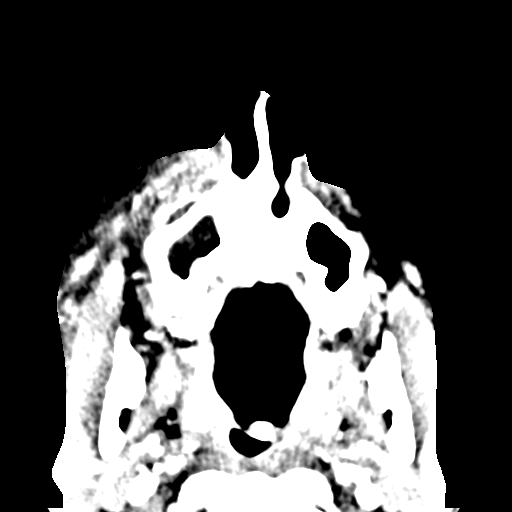
[im 35/77  bone]
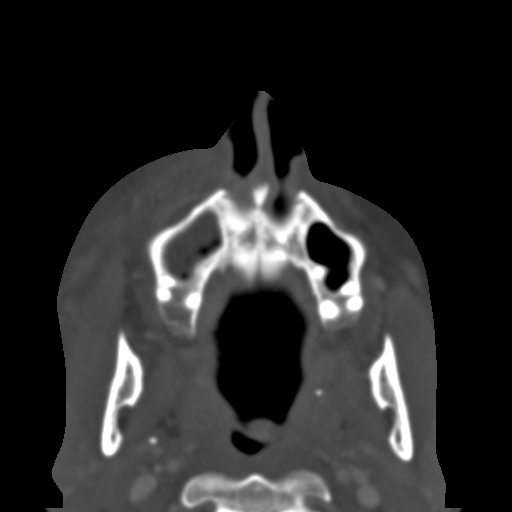
[im 42/77  bone]
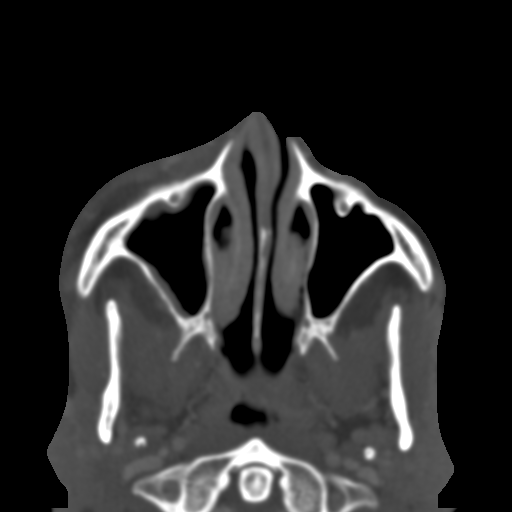
[im 50/77  bone]
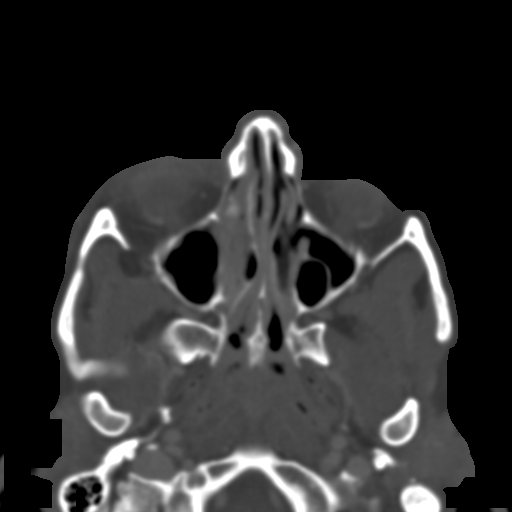
[im 58/77  bone]
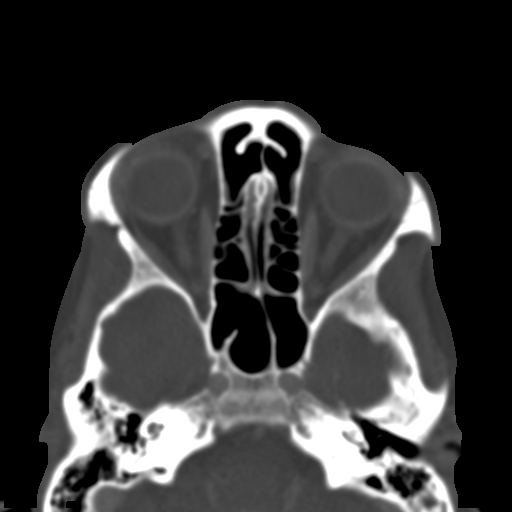
[im 63/77  brain]
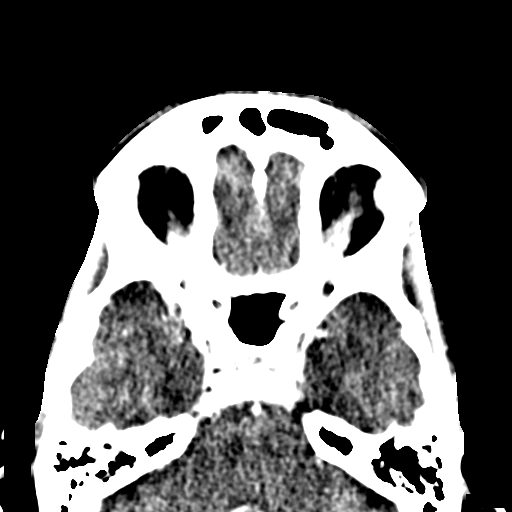
[im 63/77  bone]
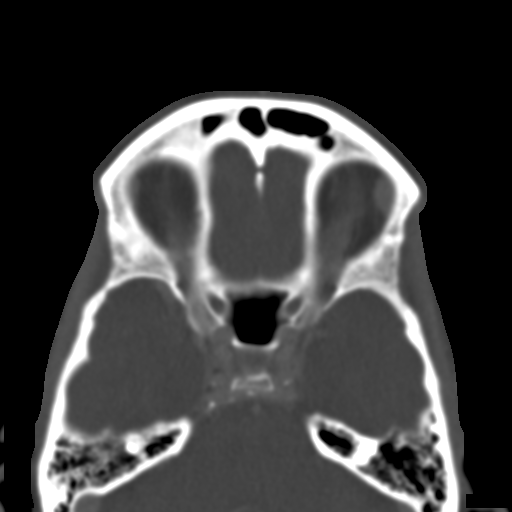
[im 71/77  bone]
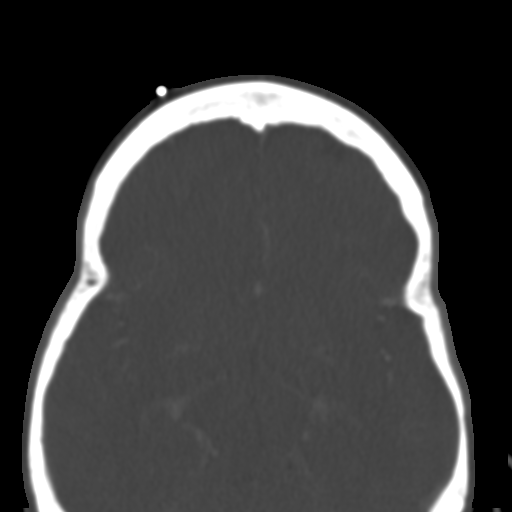

[Series 602: <mpr thick range> · coronal · 0.30mm/px · 3 of 71 slices shown]
[im 24/71  bone]
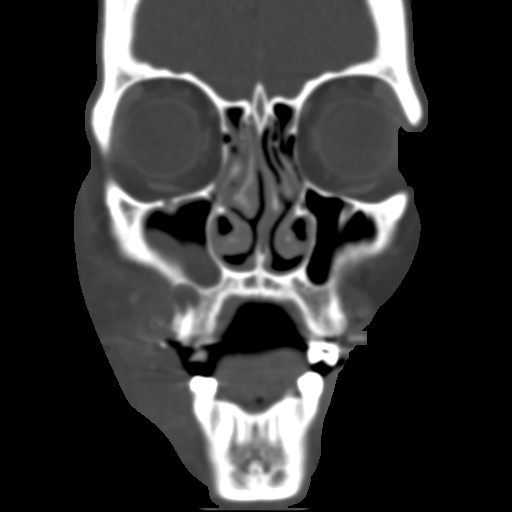
[im 32/71  bone]
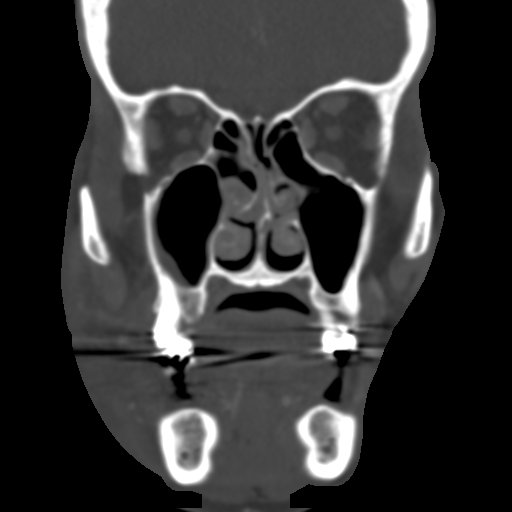
[im 39/71  bone]
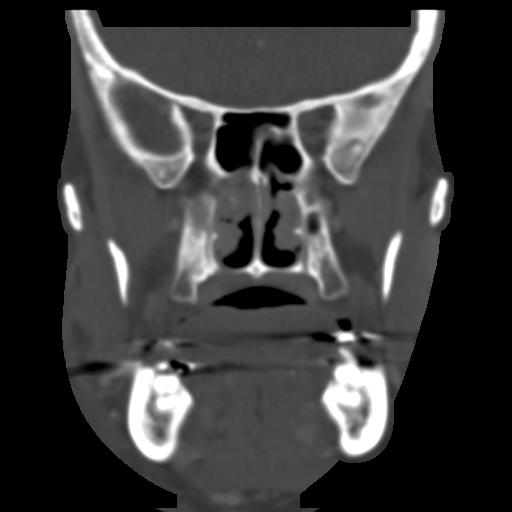

[Series 603: <mpr thick range(1)> · sagittal · 0.30mm/px · 3 of 76 slices shown]
[im 26/76  bone]
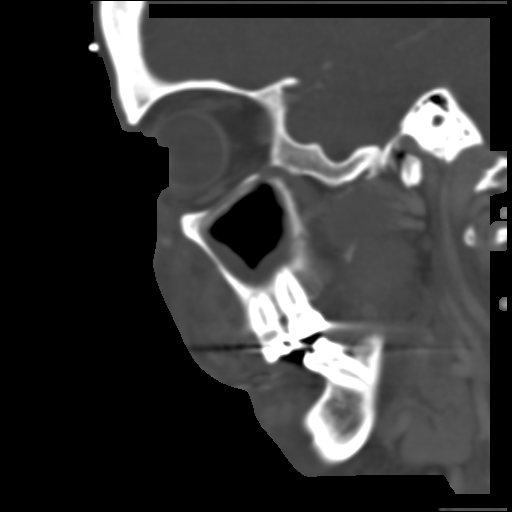
[im 38/76  bone]
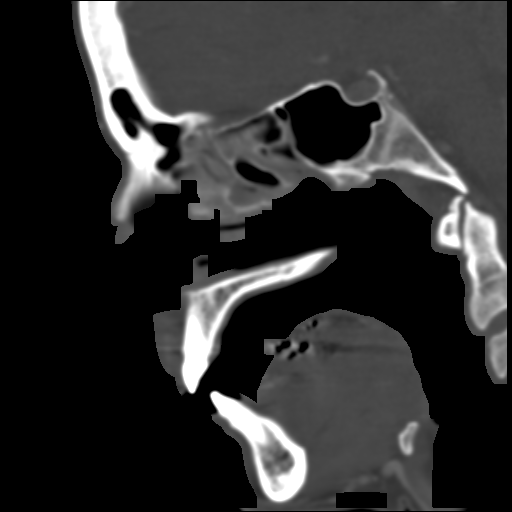
[im 51/76  bone]
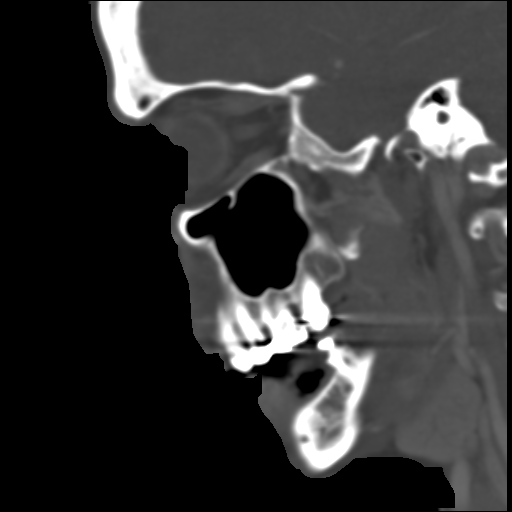

[16 of 47 positions shown; findings below may reference images not displayed]

FINDINGS: There is extensive periapical lucency around the right
upper first premolar.  There is a fluid collection extending
subperiosteal into  the soft tissues compatible with an abscess
measuring approximately 6 x 10 mm.  There is surrounding cellulitis
with soft tissue thickening in the subcutaneous fat of the cheek.
In addition there are multiple caries present in the teeth
including the right upper cuspid and first premolar.

Mucosal thickening in the paranasal sinuses.  No air-fluid level.
No other periapical abscess.  No fracture or mass.  The orbit is
intact.
IMPRESSION: Periapical lucency and dental abscess extending into the soft
tissues involving the right upper first premolar.  There is
overlying cellulitis.

## 2016-03-18 ENCOUNTER — Emergency Department (HOSPITAL_COMMUNITY): Payer: Self-pay

## 2016-03-18 ENCOUNTER — Emergency Department (HOSPITAL_COMMUNITY)
Admission: EM | Admit: 2016-03-18 | Discharge: 2016-03-18 | Disposition: A | Discharge status: Home / Self Care | Payer: Self-pay | Attending: Emergency Medicine | Admitting: Emergency Medicine

## 2016-03-18 ENCOUNTER — Encounter (HOSPITAL_COMMUNITY): Payer: Self-pay

## 2016-03-18 DIAGNOSIS — L03011 Cellulitis of right finger: Secondary | ICD-10-CM | POA: Insufficient documentation

## 2016-03-18 DIAGNOSIS — F17210 Nicotine dependence, cigarettes, uncomplicated: Secondary | ICD-10-CM | POA: Insufficient documentation

## 2016-03-18 DIAGNOSIS — R791 Abnormal coagulation profile: Secondary | ICD-10-CM | POA: Insufficient documentation

## 2016-03-18 LAB — BASIC METABOLIC PANEL
Anion gap: 4 — ABNORMAL LOW (ref 5–15)
BUN: 11 mg/dL (ref 6–20)
CALCIUM: 8.7 mg/dL — AB (ref 8.9–10.3)
CO2: 24 mmol/L (ref 22–32)
CREATININE: 0.97 mg/dL (ref 0.44–1.00)
Chloride: 114 mmol/L — ABNORMAL HIGH (ref 101–111)
Glucose, Bld: 101 mg/dL — ABNORMAL HIGH (ref 65–99)
Potassium: 4 mmol/L (ref 3.5–5.1)
SODIUM: 142 mmol/L (ref 135–145)

## 2016-03-18 LAB — CBC WITH DIFFERENTIAL/PLATELET
BASOS PCT: 1 %
Basophils Absolute: 0.1 10*3/uL (ref 0.0–0.1)
EOS ABS: 0.1 10*3/uL (ref 0.0–0.7)
Eosinophils Relative: 1 %
HCT: 39.6 % (ref 36.0–46.0)
Hemoglobin: 13.5 g/dL (ref 12.0–15.0)
Lymphocytes Relative: 22 %
Lymphs Abs: 2.8 10*3/uL (ref 0.7–4.0)
MCH: 30.3 pg (ref 26.0–34.0)
MCHC: 34.1 g/dL (ref 30.0–36.0)
MCV: 88.8 fL (ref 78.0–100.0)
MONO ABS: 1.1 10*3/uL — AB (ref 0.1–1.0)
MONOS PCT: 9 %
NEUTROS PCT: 67 %
Neutro Abs: 8.5 10*3/uL — ABNORMAL HIGH (ref 1.7–7.7)
Platelets: 432 10*3/uL — ABNORMAL HIGH (ref 150–400)
RBC: 4.46 MIL/uL (ref 3.87–5.11)
RDW: 14.3 % (ref 11.5–15.5)
WBC: 12.6 10*3/uL — ABNORMAL HIGH (ref 4.0–10.5)

## 2016-03-18 LAB — PROTIME-INR
INR: 1.01
PROTHROMBIN TIME: 13.3 s (ref 11.4–15.2)

## 2016-03-18 LAB — I-STAT CG4 LACTIC ACID, ED: Lactic Acid, Venous: 0.81 mmol/L (ref 0.5–1.9)

## 2016-03-18 MED ORDER — MORPHINE SULFATE (PF) 4 MG/ML IV SOLN
4.0000 mg | Freq: Once | INTRAVENOUS | Status: AC
  Administered 2016-03-18: 4 mg via INTRAVENOUS
  Filled 2016-03-18: qty 1

## 2016-03-18 MED ORDER — DOXYCYCLINE HYCLATE 100 MG PO CAPS: 100.0000 mg | ORAL_CAPSULE | Freq: Two times a day (BID) | ORAL | 0 refills | Status: AC

## 2016-03-18 MED ORDER — MORPHINE SULFATE (PF) 4 MG/ML IV SOLN
2.0000 mg | Freq: Once | INTRAVENOUS | Status: AC
  Administered 2016-03-18: 2 mg via INTRAVENOUS
  Filled 2016-03-18: qty 1

## 2016-03-18 MED ORDER — VANCOMYCIN HCL IN DEXTROSE 1-5 GM/200ML-% IV SOLN
1000.0000 mg | Freq: Once | INTRAVENOUS | Status: AC
  Administered 2016-03-18: 1000 mg via INTRAVENOUS
  Filled 2016-03-18: qty 200

## 2016-03-18 MED ORDER — TETANUS-DIPHTH-ACELL PERTUSSIS 5-2.5-18.5 LF-MCG/0.5 IM SUSP
0.5000 mL | Freq: Once | INTRAMUSCULAR | Status: AC
  Administered 2016-03-18: 0.5 mL via INTRAMUSCULAR
  Filled 2016-03-18: qty 0.5

## 2016-03-18 MED ORDER — OXYCODONE-ACETAMINOPHEN 5-325 MG PO TABS: 1.0000 | ORAL_TABLET | ORAL | 0 refills | Status: AC | PRN

## 2016-03-18 NOTE — Discharge Instructions (Signed)
Please take your antibiotics as directed by the hand surgery team. Please eat breakfast and call them in the morning. They will see you tomorrow in the afternoon. Please take the pain medicine as needed to help with the symptoms. Please do soapy, warm water soaks of the hand several times a day. He will determine tomorrow if it needs to be opened up. If any symptoms acutely worsen, please return to the nearest emergency department.

## 2016-03-18 NOTE — ED Triage Notes (Signed)
Pt cut finger x 1 week ago. Suspected infection. Antibiotics finished

## 2016-03-18 NOTE — ED Provider Notes (Signed)
[facility]-EMERGENCY DEPT Provider Note   CSN: [id number] Arrival date & time: 03/18/16  1545     History   Chief Complaint Chief Complaint  Patient presents with  . Wound Infection    HPI Jenny Kaufman is a right handed 51 y.o. female with a past medical history significant for depression who presents with right index finger infection. 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 She reports that he tried to soak it several times but he continued to worsen in pain, redness, and swelling. She reports that 4 days ago, she had leftover antibiotics and took several doses. She is unsure of medication but thinks it may be amoxicillin which she needed for a dental problem years ago. She says that the pain is continuing to worsen and now is draining pus. Resents for further evaluation and symptom management.  She reports that at baseline, she has some numbness in the tip of the right index finger secondary to an injury in the past. She reports that she has cut that finger when she was young.   HPI  Past Medical History:  Diagnosis Date  . Arthritis   . At risk for dental problems   . Complication of anesthesia    woke up crying  . Depression     There are no active problems to display for this patient.   Past Surgical History:  Procedure Laterality Date  . CARPAL TUNNEL RELEASE    . DENTAL SURGERY      OB History    No data available       Home Medications    Prior to Admission medications   Medication Sig Start Date End Date Taking? Authorizing Provider  ibuprofen (ADVIL,MOTRIN) 200 MG tablet Take 400 mg by mouth every 8 (eight) hours as needed for headache, mild pain or moderate pain.    Yes Historical Provider, MD    Family History History reviewed. No pertinent family history.  Social History Social History    Substance Use Topics  . Smoking status: Current Every Day Smoker    Packs/day: 1.00    Years: 37.00    Types: Cigarettes  . Smokeless tobacco: Never Used  . Alcohol use No     Allergies   Patient has no known allergies.   Review of Systems Review of Systems  Constitutional: Positive for chills. Negative for activity change, appetite change, diaphoresis, fatigue and fever.  HENT: Negative for congestion and rhinorrhea.   Eyes: Negative for visual disturbance.  Respiratory: Negative for cough, chest tightness, shortness of breath and stridor.   Cardiovascular: Negative for chest pain, palpitations and leg swelling.  Gastrointestinal: Negative for abdominal distention, abdominal pain, constipation, diarrhea, nausea and vomiting.  Genitourinary: Negative for difficulty urinating, dysuria, flank pain, frequency, hematuria, menstrual problem, pelvic pain, vaginal bleeding and vaginal discharge.  Musculoskeletal: Negative for back pain and neck pain.  Skin: Positive for color change, rash and wound.  Neurological: Negative for dizziness, weakness, light-headedness, numbness and headaches.  Psychiatric/Behavioral: Negative for agitation and confusion.  All other systems reviewed and are negative.    Physical Exam Updated Vital Signs BP 118/85 (BP Location: Left Arm)   Pulse 81   Temp 98 F (36.7 C) (Oral)   Resp 20   SpO2 100%   Physical Exam  Constitutional: She is oriented to person, place, and  time. She appears well-developed and well-nourished. No distress.  HENT:  Head: Normocephalic and atraumatic.  Right Ear: External ear normal.  Left Ear: External ear normal.  Nose: Nose normal.  Mouth/Throat: Oropharynx is clear and moist. No oropharyngeal exudate.  Eyes: Conjunctivae and EOM are normal. Pupils are equal, round, and reactive to light.  Neck: Normal range of motion. Neck supple.  Cardiovascular: Normal rate.   No murmur heard. Pulmonary/Chest: Effort normal. No  stridor. No respiratory distress.  Abdominal: Soft. She exhibits no distension. There is no tenderness. There is no rebound.  Musculoskeletal: She exhibits edema and tenderness.       Right hand: She exhibits tenderness, bony tenderness, laceration and swelling. She exhibits normal range of motion and normal capillary refill. Normal sensation noted. Normal strength noted.       Hands: Small laceration on flexor aspect of right index fingers proximal interphalangeal joint. Pus seen at this site. Generalized swelling and erythema of the right index finger. Pain with palpation. Patient has normal range of motion. Normal capillary refill. Some erythema moving proximal from the MP joint.  Neurological: She is alert and oriented to person, place, and time. She has normal reflexes. She exhibits normal muscle tone. Coordination normal.  Skin: Skin is warm. Capillary refill takes less than 2 seconds. Rash noted. She is not diaphoretic. There is erythema.  Psychiatric: She has a normal mood and affect.  Nursing note and vitals reviewed.         ED Treatments / Results  Labs (all labs ordered are listed, but only abnormal results are displayed) Labs Reviewed  CBC WITH DIFFERENTIAL/PLATELET - Abnormal; Notable for the following:       Result Value   WBC 12.6 (*)    Platelets 432 (*)    Neutro Abs 8.5 (*)    Monocytes Absolute 1.1 (*)    All other components within normal limits  BASIC METABOLIC PANEL - Abnormal; Notable for the following:    Chloride 114 (*)    Glucose, Bld 101 (*)    Calcium 8.7 (*)    Anion gap 4 (*)    All other components within normal limits  CULTURE, BLOOD (ROUTINE X 2)  CULTURE, BLOOD (ROUTINE X 2)  PROTIME-INR  I-STAT CG4 LACTIC ACID, ED  I-STAT CG4 LACTIC ACID, ED    EKG  EKG Interpretation None       Radiology Dg Hand Complete Right  Result Date: 03/18/2016 CLINICAL DATA:  Infection to the right hand, a pain redness and swelling EXAM: RIGHT HAND -  COMPLETE 3+ VIEW COMPARISON:  None. FINDINGS: Possible tiny fracture involving the dorsal base of the second distal phalanx. Diffuse soft tissue swelling of the index finger. No subluxation. No radiopaque foreign body. IMPRESSION: Diffuse soft tissue swelling of the second digit. Possible small fracture involving the dorsal base of the distal phalanx. Electronically Signed   By: Jasmine Pang M.D.   On: 03/18/2016 18:03    Procedures Procedures (including critical care time)  Medications Ordered in ED Medications  morphine 4 MG/ML injection 2 mg (2 mg Intravenous Given 03/18/16 1801)  Tdap (BOOSTRIX) injection 0.5 mL (0.5 mLs Intramuscular Given 03/18/16 1802)  vancomycin (VANCOCIN) IVPB 1000 mg/200 mL premix (0 mg Intravenous Stopped 03/18/16 2119)  morphine 4 MG/ML injection 4 mg (4 mg Intravenous Given 03/18/16 1943)     Initial Impression / Assessment and Plan / ED Course  I have reviewed the triage vital signs and the nursing notes.  Pertinent  labs & imaging results that were available during my care of the patient were reviewed by me and considered in my medical decision making (see chart for details).    Jenny Kaufman is a right handed 51 y.o. female with a past medical history significant for depression who presents with right index finger infection.  History and exam are seen above.  On exam, patient has erythema on the right index finger. There is swelling, tenderness, and color change. Patient has an area of laceration with pus on the flexor proximal interphalangeal joint space. Patient has pain with finger movement. Patient has sensation in the distal finger. Patient has normal capillary refill. Patient has some erythema tracking up past the metacarpophalangeal joint. Patient has no wrist pain, wrist tenderness, or change in range of motion of the wrist. No other abnormalities on exam.   Patient will have x-ray to look for bony involvement or subcutaneous air. Patient screening  laboratory testing. Patient does have chills as the only systemic symptom.   Patient given pain medicine and will have workup to look for worsening infection.  X-ray shows no evidence of bony involvement. Soft tissue swelling seen. Patient had slight leukocytosis.   Given physical exam, do not feel patient has a flexor tenosynovitis.  Orthopedic hand team was called. Dr. Mina MarbleWeingold recommended IV vancomycin in the emergency room. They also recommended patient be discharged with prescription for doxycycline and they will see the patient tomorrow for evaluation in clinic. They recommended patient do soapy water soaks.   Pt was given tetanus vaccination and felt better after pain medications.    Patient understood return precautions for new or worsening symptoms. Patient given pain medication. Patient had no other questions or concerns and understands plan for follow-up. Patient discharged in good condition.    Final Clinical Impressions(s) / ED Diagnoses   Final diagnoses:  Cellulitis of finger of right hand    New Prescriptions Discharge Medication List as of 03/18/2016  9:59 PM    START taking these medications   Details  doxycycline (VIBRAMYCIN) 100 MG capsule Take 1 capsule (100 mg total) by mouth 2 (two) times daily., Starting Wed 03/18/2016, Until Wed 03/25/2016, Print    oxyCODONE-acetaminophen (PERCOCET/ROXICET) 5-325 MG tablet Take 1 tablet by mouth every 4 (four) hours as needed for severe pain., Starting Wed 03/18/2016, Print        Clinical Impression: 1. Cellulitis of finger of right hand     Disposition: Discharge  Condition: Good  I have discussed the results, Dx and Tx plan with the pt(& family if present). He/she/they expressed understanding and agree(s) with the plan. Discharge instructions discussed at great length. Strict return precautions discussed and pt &/or family have verbalized understanding of the instructions. No further questions at time of discharge.     Discharge Medication List as of 03/18/2016  9:59 PM    START taking these medications   Details  doxycycline (VIBRAMYCIN) 100 MG capsule Take 1 capsule (100 mg total) by mouth 2 (two) times daily., Starting Wed 03/18/2016, Until Wed 03/25/2016, Print    oxyCODONE-acetaminophen (PERCOCET/ROXICET) 5-325 MG tablet Take 1 tablet by mouth every 4 (four) hours as needed for severe pain., Starting Wed 03/18/2016, Print        Follow Up: Dairl PonderMatthew Weingold, MD [address] [place] [id number] [phone]   Please call Dr. Ronie SpiesWeingold's office tomorrow in the AM and he will see you tomorrow. Please do not eat lunch before going.  [facility]-EMERGENCY DEPT  [id number] [place] [address] [phone]  If symptoms worsen     Heide Scales, MD 03/19/16 0201

## 2016-03-23 LAB — CULTURE, BLOOD (ROUTINE X 2)
CULTURE: NO GROWTH
CULTURE: NO GROWTH

## 2018-08-02 IMAGING — DX DG HAND COMPLETE 3+V*R*
3 series · 3 of 3 positions shown · non-contrast
Comparison: None.

CLINICAL DATA: Infection to the right hand, a pain redness and
swelling

EXAM:
RIGHT HAND - COMPLETE 3+ VIEW

[hand pa]
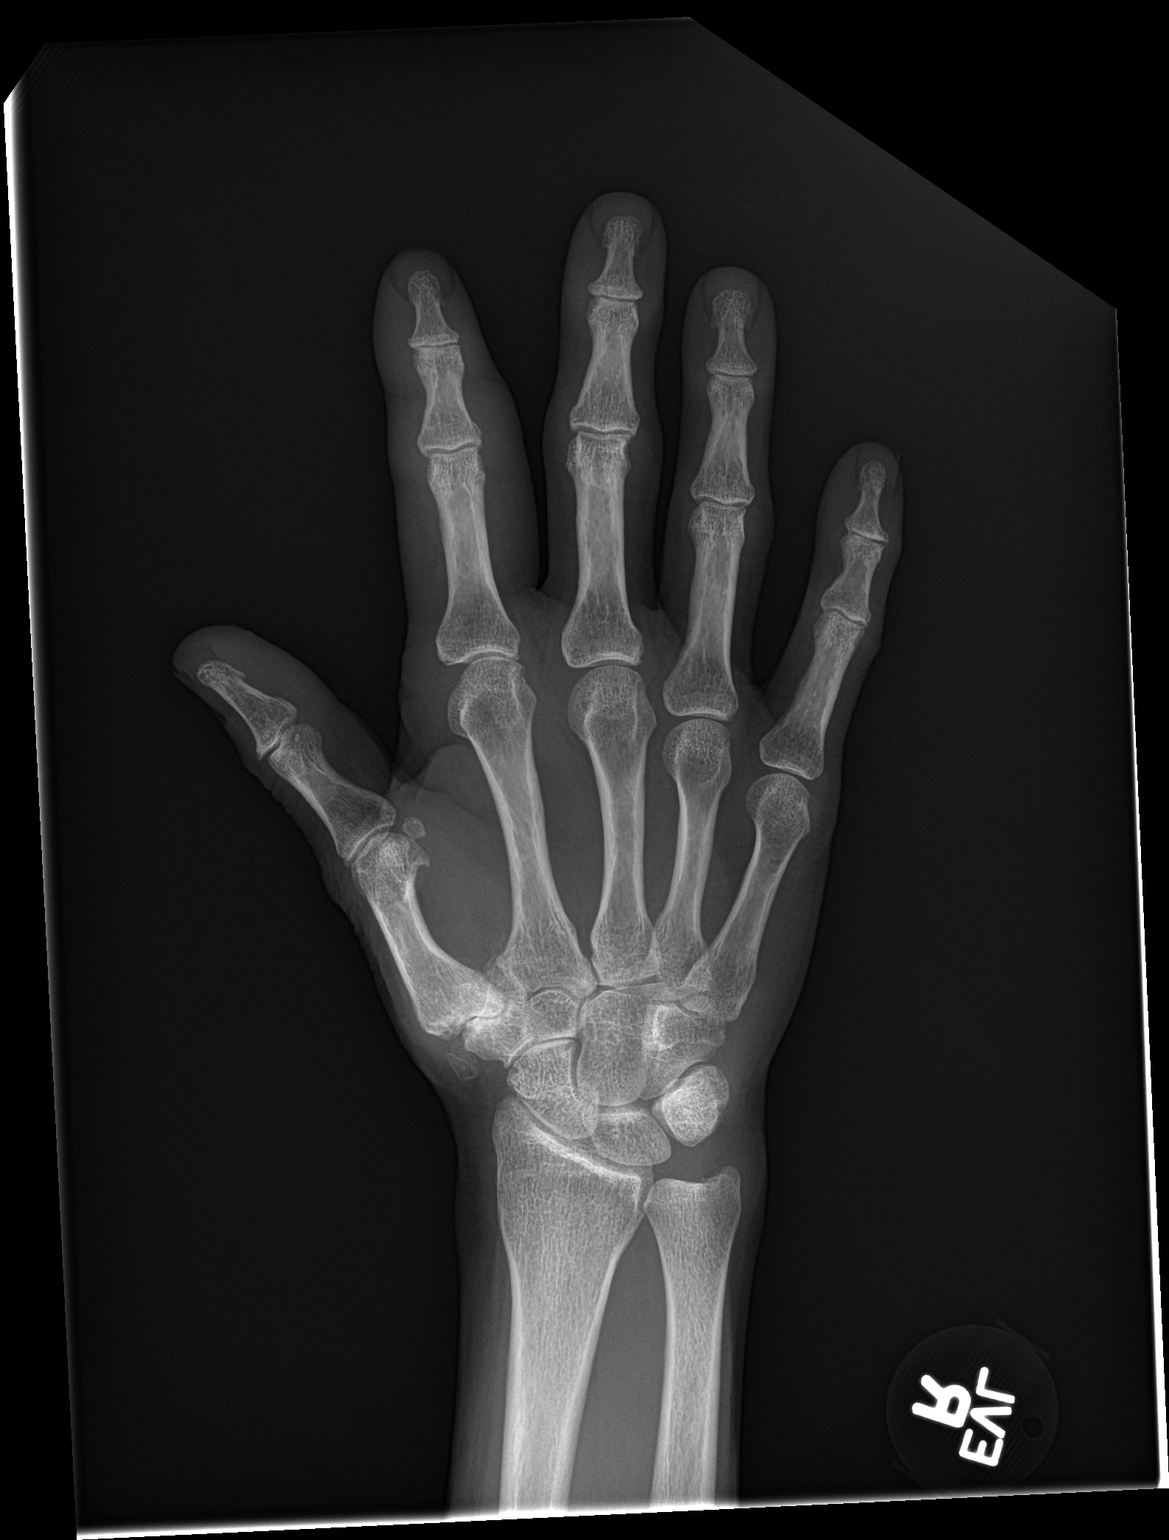

[hand lat]
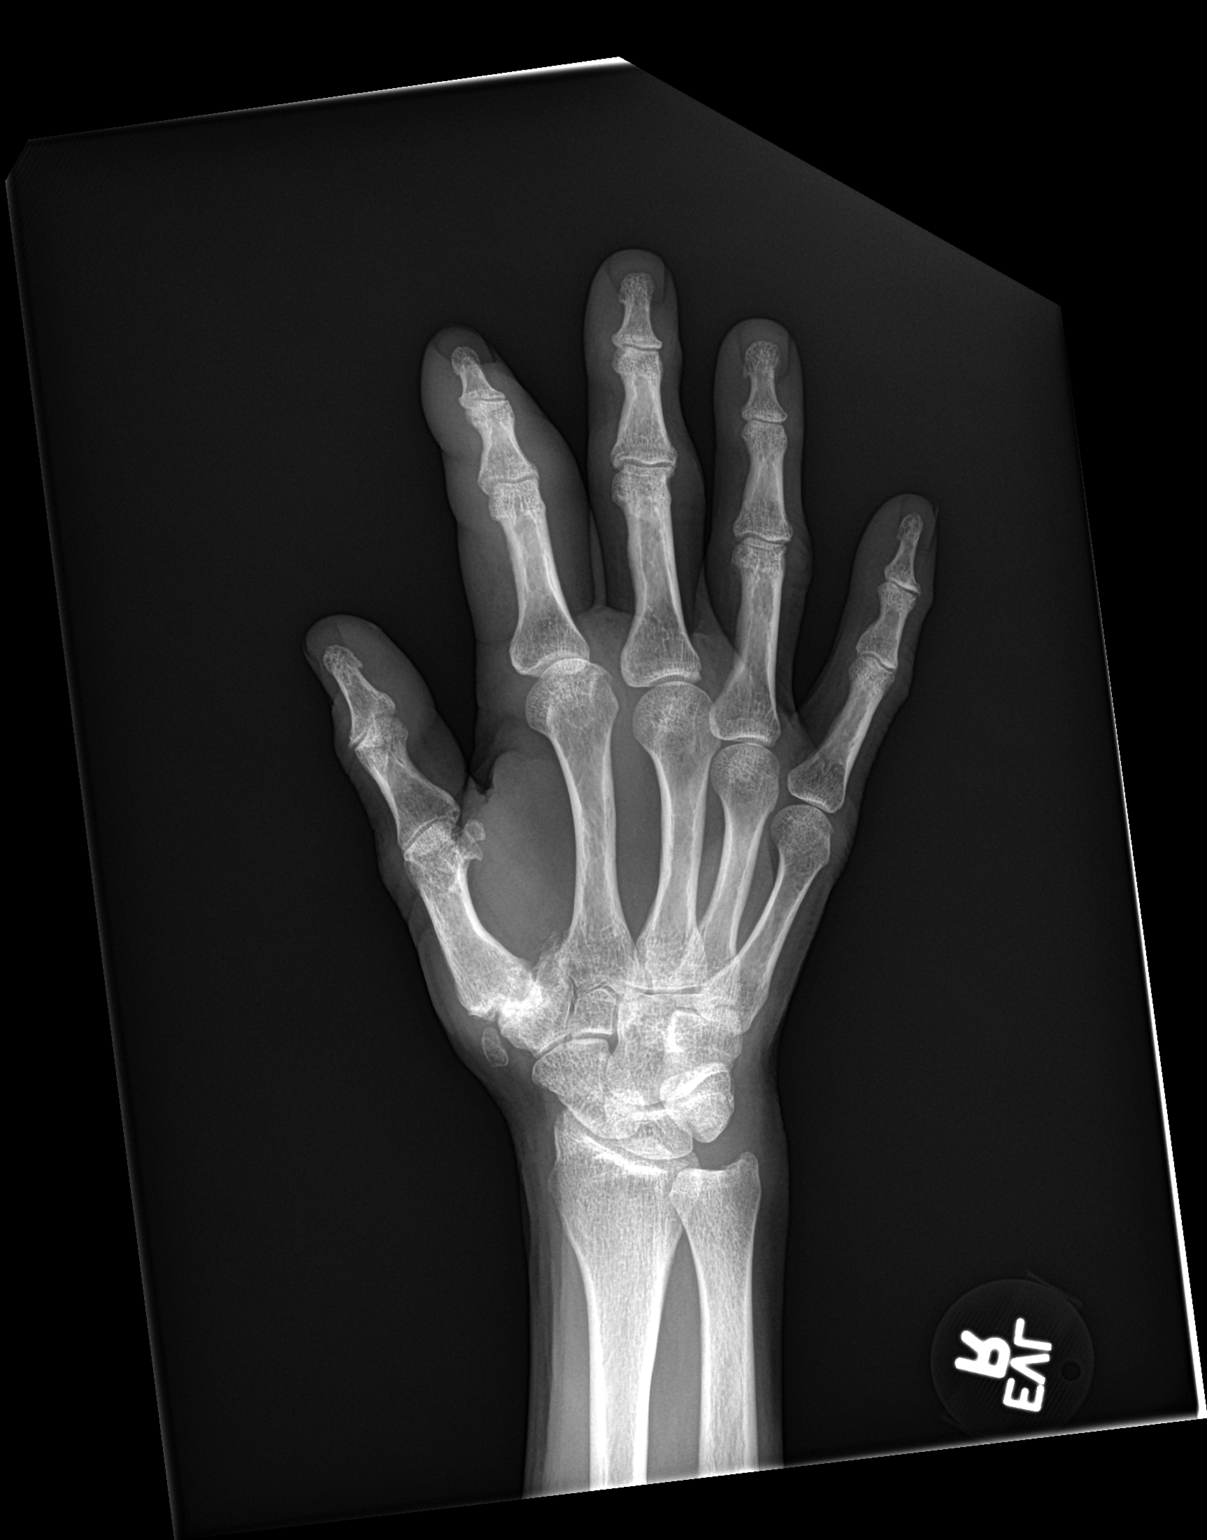

[hand obl]
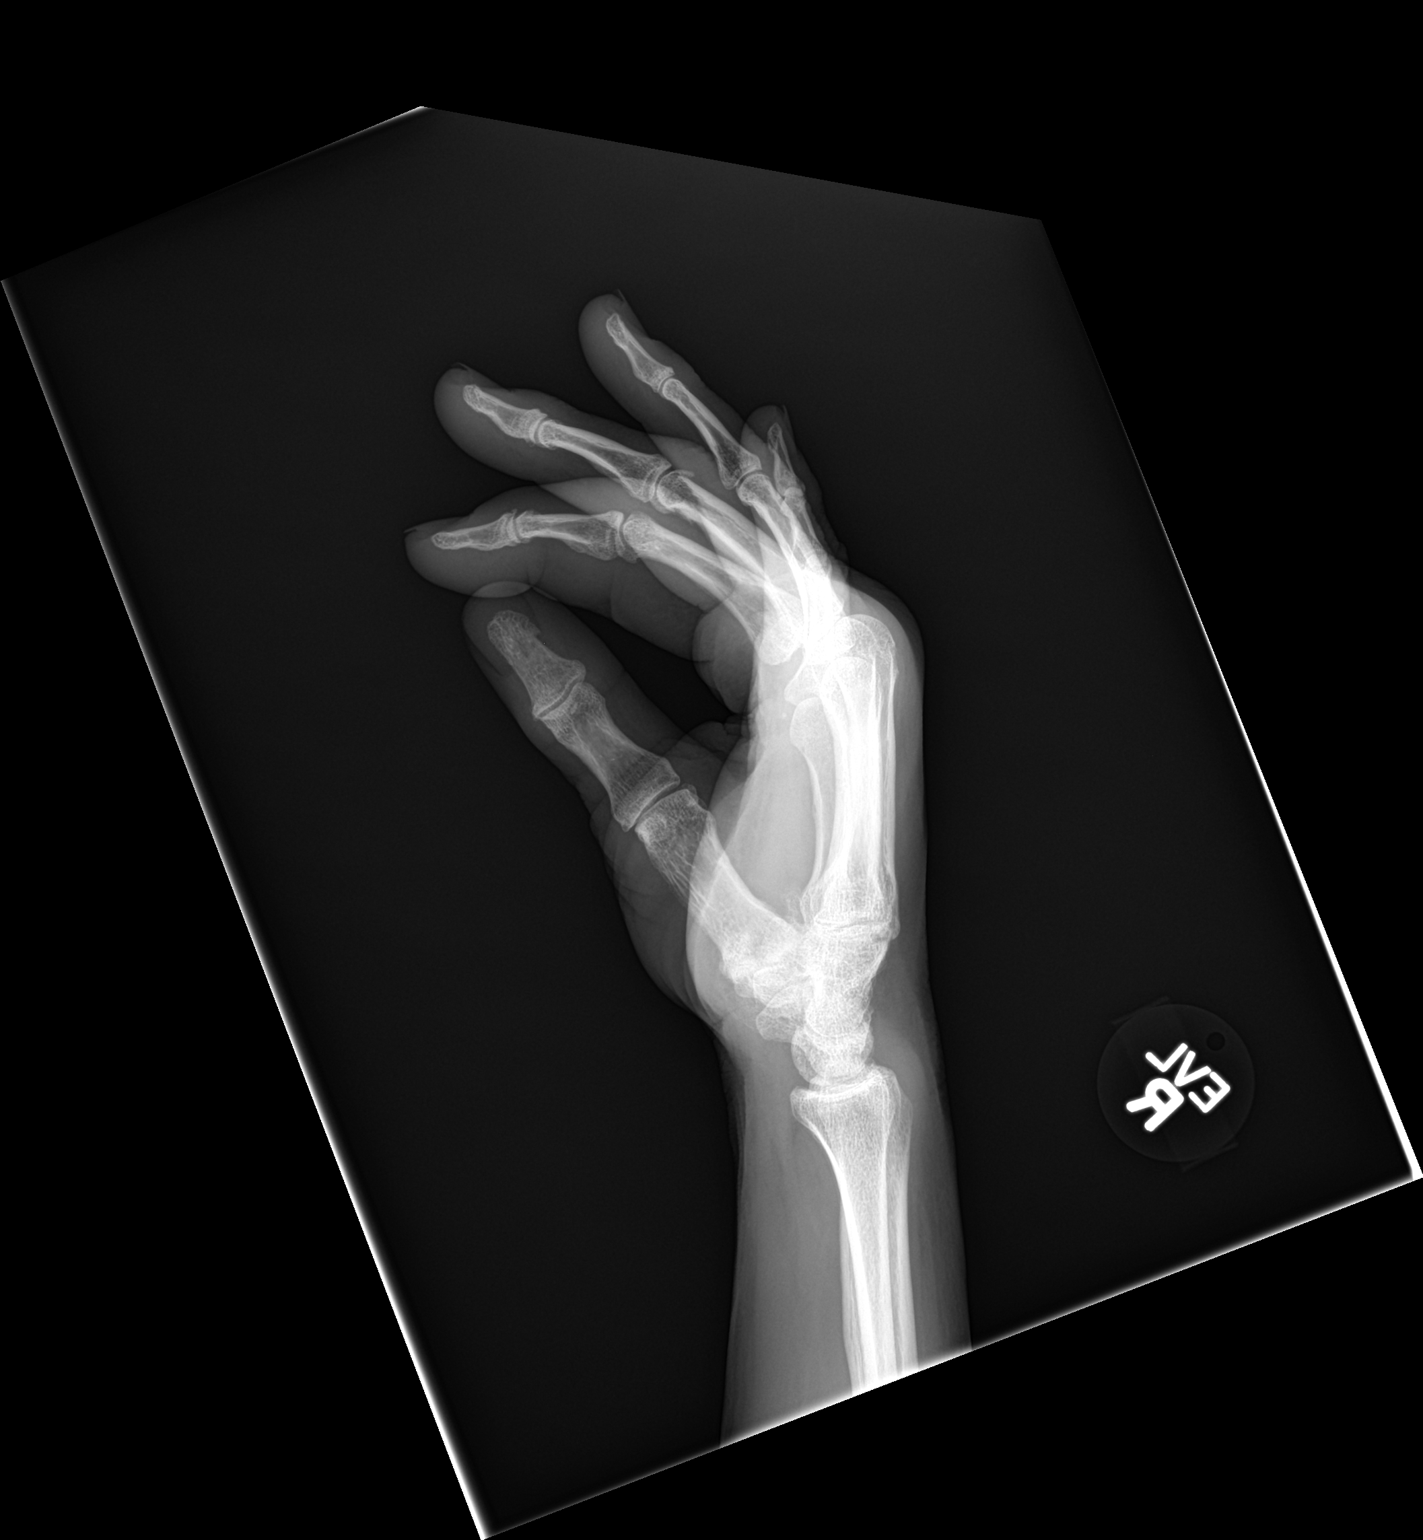

[3 of 3 positions shown; findings below may reference images not displayed]

FINDINGS: Possible tiny fracture involving the dorsal base of the second
distal phalanx. Diffuse soft tissue swelling of the index finger. No
subluxation. No radiopaque foreign body.
IMPRESSION: Diffuse soft tissue swelling of the second digit. Possible small
fracture involving the dorsal base of the distal phalanx.
